# Patient Record
Sex: Female | Born: 1950 | Race: Black or African American | Hispanic: No | Marital: Married | Smoking: Never smoker
Health system: Southern US, Community
[De-identification: ages and names within clinical notes are randomized; demographics above are authoritative.]

## PROBLEM LIST (undated history)

## (undated) DIAGNOSIS — M810 Age-related osteoporosis without current pathological fracture: Secondary | ICD-10-CM

## (undated) DIAGNOSIS — K589 Irritable bowel syndrome without diarrhea: Secondary | ICD-10-CM

## (undated) DIAGNOSIS — R079 Chest pain, unspecified: Secondary | ICD-10-CM

## (undated) DIAGNOSIS — E119 Type 2 diabetes mellitus without complications: Secondary | ICD-10-CM

## (undated) DIAGNOSIS — I5030 Unspecified diastolic (congestive) heart failure: Secondary | ICD-10-CM

## (undated) DIAGNOSIS — I4729 Other ventricular tachycardia: Secondary | ICD-10-CM

## (undated) DIAGNOSIS — I340 Nonrheumatic mitral (valve) insufficiency: Secondary | ICD-10-CM

## (undated) DIAGNOSIS — I10 Essential (primary) hypertension: Secondary | ICD-10-CM

## (undated) DIAGNOSIS — M797 Fibromyalgia: Secondary | ICD-10-CM

## (undated) DIAGNOSIS — K743 Primary biliary cirrhosis: Secondary | ICD-10-CM

## (undated) DIAGNOSIS — F32A Depression, unspecified: Secondary | ICD-10-CM

## (undated) DIAGNOSIS — I493 Ventricular premature depolarization: Secondary | ICD-10-CM

## (undated) DIAGNOSIS — K219 Gastro-esophageal reflux disease without esophagitis: Secondary | ICD-10-CM

## (undated) DIAGNOSIS — M1990 Unspecified osteoarthritis, unspecified site: Secondary | ICD-10-CM

## (undated) DIAGNOSIS — M4800 Spinal stenosis, site unspecified: Secondary | ICD-10-CM

## (undated) DIAGNOSIS — I639 Cerebral infarction, unspecified: Secondary | ICD-10-CM

## (undated) DIAGNOSIS — M069 Rheumatoid arthritis, unspecified: Secondary | ICD-10-CM

## (undated) HISTORY — DX: Other ventricular tachycardia: I47.29

## (undated) HISTORY — DX: Cerebral infarction, unspecified: I63.9

## (undated) HISTORY — DX: Primary biliary cirrhosis: K74.3

## (undated) HISTORY — DX: Unspecified diastolic (congestive) heart failure: I50.30

## (undated) HISTORY — DX: Chest pain, unspecified: R07.9

## (undated) HISTORY — DX: Ventricular premature depolarization: I49.3

## (undated) HISTORY — DX: Nonrheumatic mitral (valve) insufficiency: I34.0

## (undated) HISTORY — DX: Rheumatoid arthritis, unspecified: M06.9

## (undated) HISTORY — PX: OTHER SURGICAL HISTORY: SHX169

## (undated) HISTORY — DX: Spinal stenosis, site unspecified: M48.00

## (undated) HISTORY — DX: Depression, unspecified: F32.A

## (undated) HISTORY — DX: Age-related osteoporosis without current pathological fracture: M81.0

---

## 2004-09-01 ENCOUNTER — Inpatient Hospital Stay: Payer: Self-pay | Admitting: Internal Medicine

## 2004-09-01 ENCOUNTER — Other Ambulatory Visit: Payer: Self-pay

## 2004-09-04 ENCOUNTER — Other Ambulatory Visit: Payer: Self-pay

## 2005-09-13 ENCOUNTER — Other Ambulatory Visit: Payer: Self-pay

## 2005-09-13 ENCOUNTER — Emergency Department: Payer: Self-pay | Admitting: Emergency Medicine

## 2005-09-14 ENCOUNTER — Ambulatory Visit: Payer: Self-pay | Admitting: Emergency Medicine

## 2006-07-10 ENCOUNTER — Other Ambulatory Visit: Payer: Self-pay

## 2006-07-10 ENCOUNTER — Inpatient Hospital Stay: Payer: Self-pay | Admitting: Internal Medicine

## 2007-03-07 ENCOUNTER — Other Ambulatory Visit: Payer: Self-pay

## 2007-03-07 ENCOUNTER — Emergency Department: Payer: Self-pay | Admitting: Emergency Medicine

## 2008-08-07 IMAGING — CR DG CHEST 1V PORT
1 series · 1 of 1 positions shown · non-contrast
Comparison: none

REASON FOR EXAM: Chest pain
COMMENTS:

PROCEDURE:     DXR - DXR PORTABLE CHEST SINGLE VIEW  - March 07, 2007  [DATE]
RESULT:     Comparison is made to a prior study dated 07/10/2006.
The lungs are clear. The cardiac silhouette and visualized bony skeleton are
unremarkable.

[view not recorded]
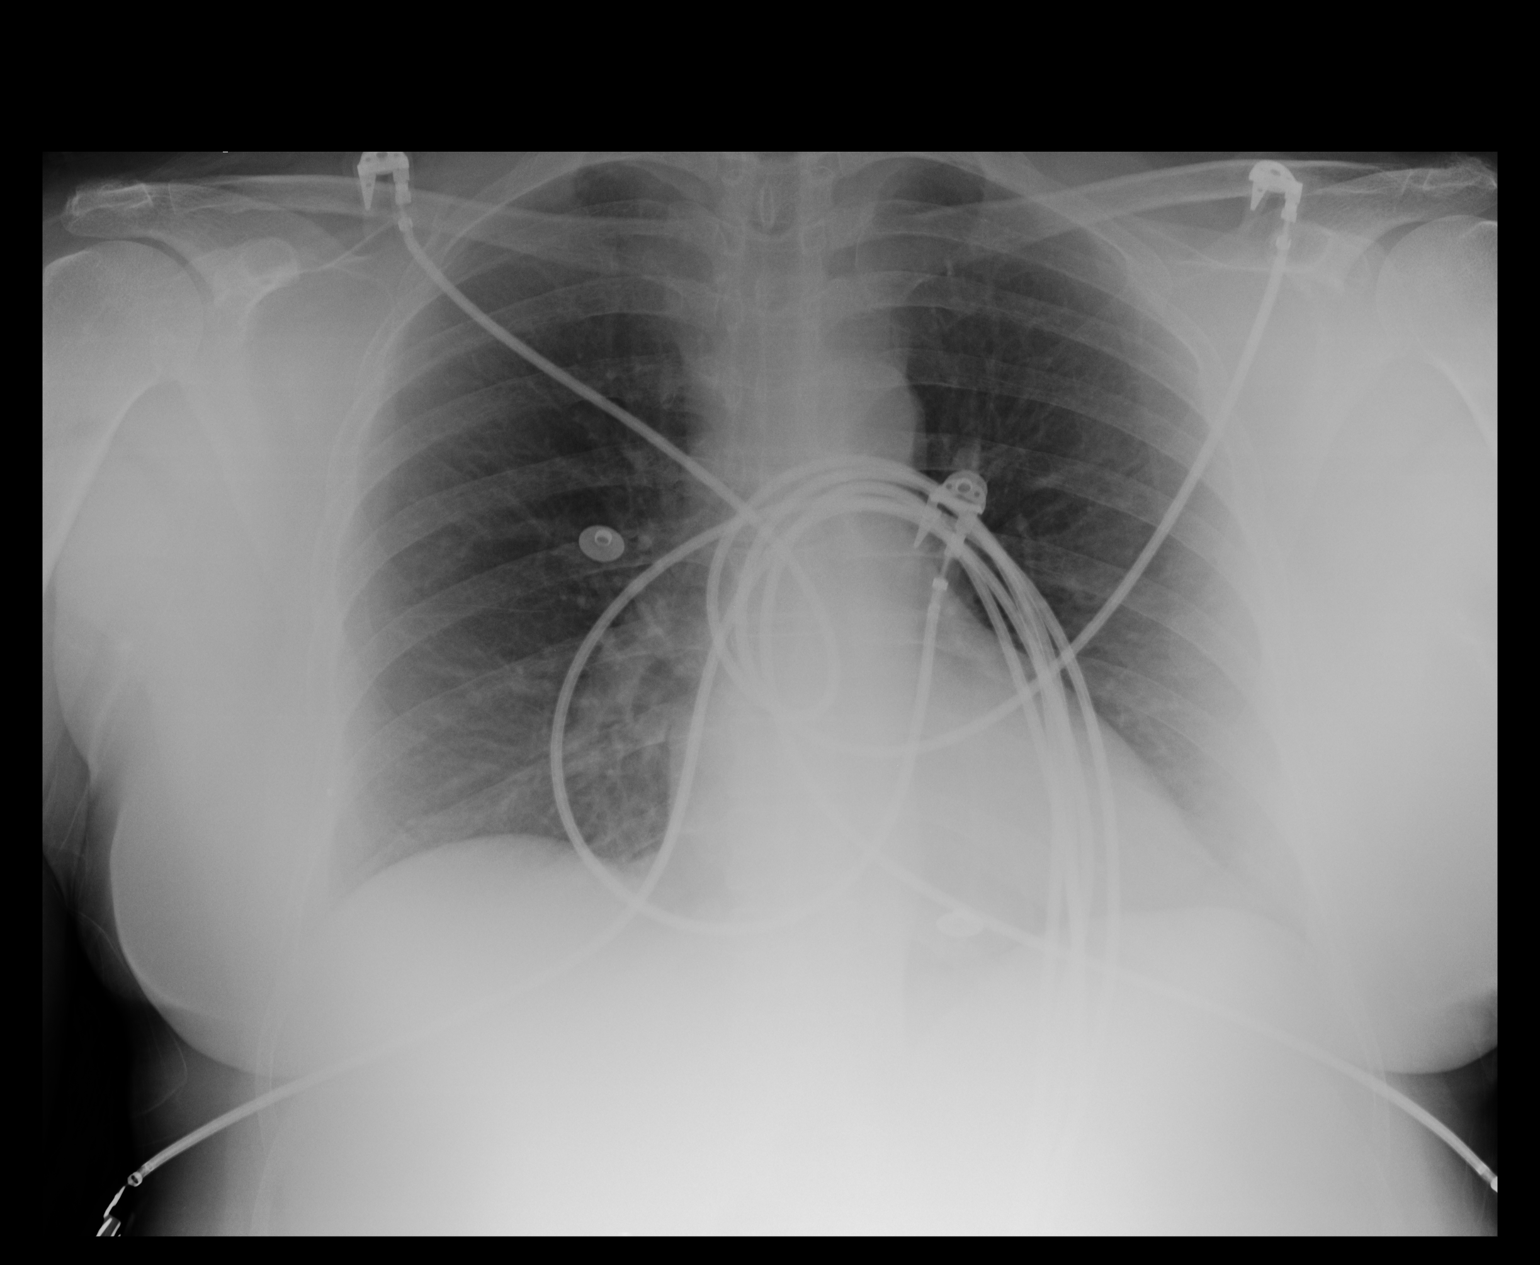

[1 of 1 positions shown; findings below may reference images not displayed]

IMPRESSION: Chest radiograph without evidence of acute cardiopulmonary
disease.

## 2009-10-01 ENCOUNTER — Emergency Department: Payer: Self-pay | Admitting: Emergency Medicine

## 2011-11-12 ENCOUNTER — Emergency Department: Payer: Self-pay | Admitting: Emergency Medicine

## 2013-04-02 DIAGNOSIS — I10 Essential (primary) hypertension: Secondary | ICD-10-CM | POA: Diagnosis present

## 2015-11-04 ENCOUNTER — Emergency Department
Admission: EM | Admit: 2015-11-04 | Discharge: 2015-11-05 | Disposition: A | Discharge status: Home / Self Care | Payer: Medicare Other | Attending: Emergency Medicine | Admitting: Emergency Medicine

## 2015-11-04 ENCOUNTER — Encounter: Payer: Self-pay | Admitting: Emergency Medicine

## 2015-11-04 DIAGNOSIS — E119 Type 2 diabetes mellitus without complications: Secondary | ICD-10-CM | POA: Diagnosis not present

## 2015-11-04 DIAGNOSIS — M1990 Unspecified osteoarthritis, unspecified site: Secondary | ICD-10-CM | POA: Diagnosis not present

## 2015-11-04 DIAGNOSIS — I10 Essential (primary) hypertension: Secondary | ICD-10-CM | POA: Diagnosis not present

## 2015-11-04 DIAGNOSIS — M542 Cervicalgia: Secondary | ICD-10-CM | POA: Diagnosis present

## 2015-11-04 DIAGNOSIS — M436 Torticollis: Secondary | ICD-10-CM | POA: Diagnosis not present

## 2015-11-04 HISTORY — DX: Irritable bowel syndrome, unspecified: K58.9

## 2015-11-04 HISTORY — DX: Unspecified osteoarthritis, unspecified site: M19.90

## 2015-11-04 HISTORY — DX: Type 2 diabetes mellitus without complications: E11.9

## 2015-11-04 HISTORY — DX: Essential (primary) hypertension: I10

## 2015-11-04 HISTORY — DX: Fibromyalgia: M79.7

## 2015-11-04 HISTORY — DX: Gastro-esophageal reflux disease without esophagitis: K21.9

## 2015-11-04 MED ORDER — KETOROLAC TROMETHAMINE 30 MG/ML IJ SOLN
30.0000 mg | Freq: Once | INTRAMUSCULAR | Status: AC
  Administered 2015-11-04: 30 mg via INTRAMUSCULAR
  Filled 2015-11-04: qty 1

## 2015-11-04 MED ORDER — DIAZEPAM 5 MG/ML IJ SOLN
5.0000 mg | Freq: Once | INTRAMUSCULAR | Status: AC
  Administered 2015-11-04: 5 mg via INTRAMUSCULAR
  Filled 2015-11-04: qty 2

## 2015-11-04 MED ORDER — DIAZEPAM 2 MG PO TABS: 2.0000 mg | ORAL_TABLET | Freq: Four times a day (QID) | ORAL | Status: AC | PRN

## 2015-11-04 NOTE — ED Notes (Signed)
Pt arrived to the ED for complaints on neck pain and stiffness secondary to the Pt laying in the ground and positioning her neck in an unusual position. Pt states that she was trying to check a leak in her toilet and when she tried to stand up she noticed that she could not get her neck straight. Pt is AOx4 with mild neck pain.

## 2015-11-04 NOTE — Discharge Instructions (Signed)
Acute Torticollis °Torticollis is a condition in which the muscles of the neck tighten (contract) abnormally, causing the neck to twist and the head to move into an unnatural position. Torticollis that develops suddenly is called acute torticollis. If torticollis becomes chronic and is left untreated, the face and neck can become deformed. °CAUSES °This condition may be caused by: °· Sleeping in an awkward position (common). °· Extending or twisting the neck muscles beyond their normal position. °· Infection. °In some cases, the cause may not be known. °SYMPTOMS °Symptoms of this condition include: °· An unnatural position of the head. °· Neck pain. °· A limited ability to move the neck. °· Twisting of the neck to one side. °DIAGNOSIS °This condition is diagnosed with a physical exam. You may also have imaging tests, such as an X-ray, CT scan, or MRI. °TREATMENT °Treatment for this condition involves trying to relax the neck muscles. It may include: °· Medicines or shots. °· Physical therapy. °· Surgery. This may be done in severe cases. °HOME CARE INSTRUCTIONS °· Take medicines only as directed by your health care provider. °· Do stretching exercises and massage your neck as directed by your health care provider. °· Keep all follow-up visits as directed by your health care provider. This is important. °SEEK MEDICAL CARE IF: °· You develop a fever. °SEEK IMMEDIATE MEDICAL CARE IF: °· You develop difficulty breathing. °· You develop noisy breathing (stridor). °· You start drooling. °· You have trouble swallowing or have pain with swallowing. °· You develop numbness or weakness in your hands or feet. °· You have changes in your speech, understanding, or vision. °· Your pain gets worse. °  °This information is not intended to replace advice given to you by your health care provider. Make sure you discuss any questions you have with your health care provider. °  °Document Released: 04/09/2000 Document Revised:  08/27/2014 Document Reviewed: 04/08/2014 °Elsevier Interactive Patient Education ©2016 [employer] ° °

## 2015-11-04 NOTE — ED Provider Notes (Signed)
[facility] Emergency Department Provider Note ____________________________________________  Time seen: Approximately 11:19 PM  I have reviewed the triage vital signs and the nursing notes.   HISTORY  Chief Complaint Neck Pain and Spasms    HPI Victoria Potter is a 65 y.o. female who presents to the emergency department for evaluation of neck pain. She states that she got down on the floor and reached around the back of the toilet to see if water was coming from around the bottom or under the tank and when she tried to stand back up, she could not turn her head. She denies specific injury. Incident occurred at approximately 7 PM and has not gotten any better over time. She has not taken anything for pain prior to arrival.  Past Medical History  Diagnosis Date  . Hypertension   . Diabetes mellitus without complication (HCC)   . Arthritis   . Fibromyalgia   . GERD (gastroesophageal reflux disease)   . IBS (irritable bowel syndrome)     There are no active problems to display for this patient.   Past Surgical History  Procedure Laterality Date  . Cardiac oblation      Current Outpatient Rx  Name  Route  Sig  Dispense  Refill  . diazepam (VALIUM) 2 MG tablet   Oral   Take 1 tablet (2 mg total) by mouth every 6 (six) hours as needed for muscle spasms.   12 tablet   0     Allergies Erythromycin and Sulfa antibiotics  History reviewed. No pertinent family history.  Social History Social History  Substance Use Topics  . Smoking status: Never Smoker   . Smokeless tobacco: None  . Alcohol Use: No    Review of Systems Constitutional: No recent illness. Cardiovascular: Denies chest pain or palpitations. Respiratory: Denies shortness of breath. Musculoskeletal: Pain in Left neck Skin: Negative for rash, wound, lesion. Neurological: Negative for focal weakness or numbness.  ____________________________________________   PHYSICAL  EXAM:  VITAL SIGNS: ED Triage Vitals  Enc Vitals Group     BP 11/04/15 2233 211/87 mmHg     Pulse Rate 11/04/15 2233 81     Resp 11/04/15 2233 18     Temp 11/04/15 2233 98.4 F (36.9 C)     Temp Source 11/04/15 2233 Oral     SpO2 11/04/15 2233 98 %     Weight 11/04/15 2233 172 lb (78.019 kg)     Height 11/04/15 2233 5\' 3"  (1.6 m)     Head Cir --      Peak Flow --      Pain Score 11/04/15 2238 9     Pain Loc --      Pain Edu? --      Excl. in GC? --     Constitutional: Alert and oriented. Well appearing and in no acute distress. Eyes: Conjunctivae are normal. EOMI. Head: Atraumatic. Neck: No stridor.  Respiratory: Normal respiratory effort.   Musculoskeletal: Tenderness on palpation to the left trapezius area. There is no focal midline tenderness of the cervical or thoracic spine. Neurologic:  Normal speech and language. No gross focal neurologic deficits are appreciated. Speech is normal. No gait instability. Skin:  Skin is warm, dry and intact. Atraumatic. Psychiatric: Mood and affect are normal. Speech and behavior are normal.  ____________________________________________   LABS (all labs ordered are listed, but only abnormal results are displayed)  Labs Reviewed - No data to display ____________________________________________  RADIOLOGY  Not indicated  ____________________________________________   PROCEDURES  Procedure(s) performed: None   ____________________________________________   INITIAL IMPRESSION / ASSESSMENT AND PLAN / ED COURSE  Pertinent labs & imaging results that were available during my care of the patient were reviewed by me and considered in my medical decision making (see chart for details).  Patient was given Valium and Toradol IM in addition to a soft cervical collar while in the ER with moderate relief of pain. She is to receive a prescription for Valium  tablets. She will follow up with orthopedics for symptoms that are not  improving over the next few days. She was advised to return to the ER for symptoms that change or worsen if unable to schedule an appointment. ____________________________________________   FINAL CLINICAL IMPRESSION(S) / ED DIAGNOSES  Final diagnoses:  Torticollis, acute       Chinita Pester, FNP 11/05/15 0007  Minna Antis, MD 11/07/15 [id number]

## 2015-11-04 NOTE — ED Notes (Signed)
Pt in via triage with complaints of left sided neck pain and stiffness since approximately 1900 tonight; pt reports laying on the ground looking under her toilet and when she got up she was unable to turn her head to the right.  Pt A/Ox4, ambulatory to room, in no immediate distress at this time.

## 2023-03-11 DIAGNOSIS — N183 Chronic kidney disease, stage 3 unspecified: Secondary | ICD-10-CM | POA: Insufficient documentation

## 2023-06-17 ENCOUNTER — Emergency Department: Payer: Medicare Other

## 2023-06-17 ENCOUNTER — Other Ambulatory Visit: Payer: Self-pay

## 2023-06-17 ENCOUNTER — Emergency Department
Admission: EM | Admit: 2023-06-17 | Discharge: 2023-06-17 | Disposition: A | Discharge status: Home / Self Care | Payer: Medicare Other | Attending: Emergency Medicine | Admitting: Emergency Medicine

## 2023-06-17 DIAGNOSIS — D72829 Elevated white blood cell count, unspecified: Secondary | ICD-10-CM | POA: Insufficient documentation

## 2023-06-17 DIAGNOSIS — I10 Essential (primary) hypertension: Secondary | ICD-10-CM | POA: Diagnosis not present

## 2023-06-17 DIAGNOSIS — R7989 Other specified abnormal findings of blood chemistry: Secondary | ICD-10-CM | POA: Diagnosis not present

## 2023-06-17 DIAGNOSIS — D649 Anemia, unspecified: Secondary | ICD-10-CM | POA: Insufficient documentation

## 2023-06-17 DIAGNOSIS — R079 Chest pain, unspecified: Secondary | ICD-10-CM | POA: Diagnosis not present

## 2023-06-17 DIAGNOSIS — E1165 Type 2 diabetes mellitus with hyperglycemia: Secondary | ICD-10-CM | POA: Insufficient documentation

## 2023-06-17 DIAGNOSIS — R519 Headache, unspecified: Secondary | ICD-10-CM | POA: Insufficient documentation

## 2023-06-17 LAB — BASIC METABOLIC PANEL
Anion gap: 17 — ABNORMAL HIGH (ref 5–15)
BUN: 19 mg/dL (ref 8–23)
CO2: 16 mmol/L — ABNORMAL LOW (ref 22–32)
Calcium: 9.6 mg/dL (ref 8.9–10.3)
Chloride: 101 mmol/L (ref 98–111)
Creatinine, Ser: 1.22 mg/dL — ABNORMAL HIGH (ref 0.44–1.00)
GFR, Estimated: 48 mL/min — ABNORMAL LOW (ref >60)
Glucose, Bld: 199 mg/dL — ABNORMAL HIGH (ref 70–99)
Potassium: 4.1 mmol/L (ref 3.5–5.1)
Sodium: 134 mmol/L — ABNORMAL LOW (ref 135–145)

## 2023-06-17 LAB — CBC
HCT: 34.8 % — ABNORMAL LOW (ref 36.0–46.0)
Hemoglobin: 11.2 g/dL — ABNORMAL LOW (ref 12.0–15.0)
MCH: 28.8 pg (ref 26.0–34.0)
MCHC: 32.2 g/dL (ref 30.0–36.0)
MCV: 89.5 fL (ref 80.0–100.0)
Platelets: 365 10*3/uL (ref 150–400)
RBC: 3.89 MIL/uL (ref 3.87–5.11)
RDW: 12.6 % (ref 11.5–15.5)
WBC: 15.1 10*3/uL — ABNORMAL HIGH (ref 4.0–10.5)
nRBC: 0 % (ref 0.0–0.2)

## 2023-06-17 LAB — HEPATIC FUNCTION PANEL
ALT: 15 U/L (ref 0–44)
AST: 23 U/L (ref 15–41)
Albumin: 3.7 g/dL (ref 3.5–5.0)
Alkaline Phosphatase: 57 U/L (ref 38–126)
Bilirubin, Direct: <0.1 mg/dL (ref 0.0–0.2)
Total Bilirubin: 0.6 mg/dL (ref 0.0–1.2)
Total Protein: 7.3 g/dL (ref 6.5–8.1)

## 2023-06-17 LAB — LIPASE, BLOOD: Lipase: 34 U/L (ref 11–51)

## 2023-06-17 LAB — TROPONIN I (HIGH SENSITIVITY)
Troponin I (High Sensitivity): 16 ng/L (ref <18)
Troponin I (High Sensitivity): 14 ng/L (ref <18)

## 2023-06-17 MED ORDER — IOHEXOL 350 MG/ML SOLN
75.0000 mL | Freq: Once | INTRAVENOUS | Status: AC | PRN
  Administered 2023-06-17: 75 mL via INTRAVENOUS

## 2023-06-17 MED ORDER — ONDANSETRON HCL 4 MG/2ML IJ SOLN
4.0000 mg | Freq: Once | INTRAMUSCULAR | Status: AC
  Administered 2023-06-17: 4 mg via INTRAVENOUS
  Filled 2023-06-17: qty 2

## 2023-06-17 MED ORDER — SODIUM CHLORIDE 0.9 % IV BOLUS
1000.0000 mL | Freq: Once | INTRAVENOUS | Status: AC
  Administered 2023-06-17: 1000 mL via INTRAVENOUS

## 2023-06-17 MED ORDER — KETOROLAC TROMETHAMINE 15 MG/ML IJ SOLN
15.0000 mg | Freq: Once | INTRAMUSCULAR | Status: AC
  Administered 2023-06-17: 15 mg via INTRAVENOUS
  Filled 2023-06-17: qty 1

## 2023-06-17 MED ORDER — PROCHLORPERAZINE EDISYLATE 10 MG/2ML IJ SOLN: 10.0000 mg | Freq: Once | INTRAMUSCULAR | Status: DC

## 2023-06-17 MED ORDER — DIPHENHYDRAMINE HCL 50 MG/ML IJ SOLN: 25.0000 mg | Freq: Once | INTRAMUSCULAR | Status: DC

## 2023-06-17 NOTE — Discharge Instructions (Signed)
 You were seen in the ER today for evaluation of your headache and chest pain.  Your testing fortunately did not show an emergency cause for this.  I have placed a referral to cardiology to arrange close follow-up for further evaluation of your chest pain.  Please also follow-up with your primary care doctor for further evaluation of your symptoms.  Return to the ER for new or worsening symptoms.

## 2023-06-17 NOTE — ED Triage Notes (Addendum)
 Pt here with CP, SOB and a severe headache for a few days. Pt states she has not been breathing good for a while but worse this morning. Pt states her heart has been pounding, pain is left sided and radiates to her arm. Pt states cp is constant. Pt states she fell out of bed yesterday and hit her head.

## 2023-06-17 NOTE — ED Provider Notes (Signed)
 [facility] Provider Note    Event Date/Time   First MD Initiated Contact with Patient 06/17/23 1529     (approximate)   History   Shortness of Breath and Headache   HPI  Victoria Potter is a 73 year old female with history of HTN, diabetes, primary biliary cholangitis presenting to the emergency department for evaluation of headache and chest pain.  For the past week, patient reports she has had an ongoing generalized headache.  This morning, headache worsened and more so on her left side.  No associated focal numbness, tingling, weakness.  She additionally reports that for the past several days she has had ongoing pain over the left side of her chest with associated shortness of breath.  2 days ago she reports that she passed out, unsure if she had preceding lightheadedness.  Reports memory issues and is unsure if she is experience something similar previously.      Physical Exam   Triage Vital Signs: ED Triage Vitals  Encounter Vitals Group     BP 06/17/23 1429 (!) 182/60     Systolic BP Percentile --      Diastolic BP Percentile --      Pulse Rate 06/17/23 1429 (!) 105     Resp 06/17/23 1429 (!) 21     Temp 06/17/23 1429 98.1 F (36.7 C)     Temp Source 06/17/23 1429 Oral     SpO2 06/17/23 1429 97 %     Weight 06/17/23 1430 171 lb 15.3 oz (78 kg)     Height 06/17/23 1430 5\' 3"  (1.6 m)     Head Circumference --      Peak Flow --      Pain Score 06/17/23 1430 10     Pain Loc --      Pain Education --      Exclude from Growth Chart --     Most recent vital signs: Vitals:   06/17/23 1429 06/17/23 1600  BP: (!) 182/60 (!) 177/61  Pulse: (!) 105 90  Resp: (!) 21 16  Temp: 98.1 F (36.7 C)   SpO2: 97% 98%   General: Awake, interactive  CV:  Regular rate at the time of my evaluation, good peripheral perfusion.  Resp:  Unlabored respirations, lungs clear to auscultation Abd:  Nondistended, soft, nontender to palpation Neuro:  Symmetric  facial movement, mild generalized weakness, moving extremities spontaneously and equally, pupils equal and reactive  ED Results / Procedures / Treatments   Labs (all labs ordered are listed, but only abnormal results are displayed) Labs Reviewed  BASIC METABOLIC PANEL - Abnormal; Notable for the following components:      Result Value   Sodium 134 (*)    CO2 16 (*)    Glucose, Bld 199 (*)    Creatinine, Ser 1.22 (*)    GFR, Estimated 48 (*)    Anion gap 17 (*)    All other components within normal limits  CBC - Abnormal; Notable for the following components:   WBC 15.1 (*)    Hemoglobin 11.2 (*)    HCT 34.8 (*)    All other components within normal limits  HEPATIC FUNCTION PANEL  LIPASE, BLOOD  TROPONIN I (HIGH SENSITIVITY)  TROPONIN I (HIGH SENSITIVITY)     EKG EKG independently reviewed interpreted by myself (ER attending) demonstrates:  EKG demonstrates normal sinus rhythm rate of 98, PR 132, QRS 86, QTc 451, no acute ST changes  RADIOLOGY Imaging independently reviewed  and interpreted by myself demonstrates:  CT head without acute bleed CXR without focal consolidation CTA head and neck without LVO or other acute finding CTA chest without evidence of PE or other acute finding  PROCEDURES:  Critical Care performed: No  Procedures   MEDICATIONS ORDERED IN ED: Medications  ketorolac (TORADOL) 15 MG/ML injection 15 mg (15 mg Intravenous Given 06/17/23 1637)  sodium chloride 0.9 % bolus 1,000 mL (1,000 mLs Intravenous New Bag/Given 06/17/23 1637)  ondansetron (ZOFRAN) injection 4 mg (4 mg Intravenous Given 06/17/23 1637)  iohexol (OMNIPAQUE) 350 MG/ML injection 75 mL (75 mLs Intravenous Contrast Given 06/17/23 1706)     IMPRESSION / MDM / ASSESSMENT AND PLAN / ED COURSE  I reviewed the triage vital signs and the nursing notes.  Differential diagnosis includes, but is not limited to, intracranial bleed, benign headache, ACS, pneumonia, pneumothorax, pulmonary  embolism  Patient's presentation is most consistent with acute presentation with potential threat to life or bodily function.  73 year old female presenting with headache and chest pain.  Mild tachycardia on presentation, improved at the time of my initial evaluation.  CT head and x-Allia Wiltsey ordered from triage overall reassuring.  Labs notable for leukocytosis with WC of 15 without obvious infectious source.  Stable anemia noted.  CMP with mild creatinine elevation at 1.22.  Mild anion gap acidosis noted, but minimally elevated glucose at 199, low suspicion DKA.  Given headache ongoing for several days, will obtain CTA head and neck to further evaluate for acute pathology.  Will also obtain CT of the chest.  CT imaging overall reassuring.  Troponin x 2 negative.  Patient reevaluated, does report improvement in her symptoms. Patient is comfortable with discharge home.  Will place referral for cardiology.  Strict return precautions provided.  Patient discharged in stable condition.     FINAL CLINICAL IMPRESSION(S) / ED DIAGNOSES   Final diagnoses:  Acute nonintractable headache, unspecified headache type  Nonspecific chest pain     Rx / DC Orders   ED Discharge Orders          Ordered    Ambulatory referral to Cardiology       Comments: If you have not heard from the Cardiology office within the next 72 hours please call [phone].   06/17/23 1837             Note:  This document was prepared using Dragon voice recognition software and may include unintentional dictation errors.   [name], MD 06/17/23 [phone]

## 2023-06-23 ENCOUNTER — Encounter: Payer: Self-pay | Admitting: Nurse Practitioner

## 2023-06-23 ENCOUNTER — Ambulatory Visit: Payer: Medicare Other | Attending: Nurse Practitioner | Admitting: Nurse Practitioner

## 2023-06-23 NOTE — Progress Notes (Deleted)
 Cardiology Clinic Note   Patient Name: Victoria Potter Date of Encounter: 06/23/2023  Primary Care Provider:  Lance Morin, MD Primary Cardiologist:  None  Patient Profile    73 y.o. female with a history of chest pain, HFpEF, PVCs, nonsustained VT, hypertension, hyperlipidemia, diabetes, fibromyalgia rheumatoid arthritis on chronic steroids, primary biliary cholangitis, osteoporosis, depression, GERD, glaucoma, osteoarthritis, osteoporosis, spinal stenosis, stroke, who presents for follow-up secondary to chest pain.  Past Medical History    Past Medical History:  Diagnosis Date   (HFpEF) heart failure with preserved ejection fraction (HCC)    a. 03/2021 Echo: EF 55-60%, GrII DD, mild MR.   Arthritis    Chest pain    Depression    Diabetes mellitus without complication (HCC)    Fibromyalgia    GERD (gastroesophageal reflux disease)    Hypertension    IBS (irritable bowel syndrome)    Mitral regurgitation    a. 03/2021 Echo: Mild MR.   NSVT (nonsustained ventricular tachycardia) (HCC)    01/2011 cMRI: EF 62%, nl RV fxn, no scar/infiltration, no ARVD.   Osteoporosis    Primary biliary cholangitis (HCC)    PVC's (premature ventricular contractions)    Rheumatoid arthritis (HCC)    Spinal stenosis    Stroke [facility])    Past Surgical History:  Procedure Laterality Date   cardiac oblation      Allergies  Allergies  Allergen Reactions   Erythromycin Nausea And Vomiting   Sulfa Antibiotics Nausea And Vomiting    History of Present Illness      73 y.o. y/o female with a history of chest pain, HFpEF, PVCs, nonsustained VT, hypertension, hyperlipidemia, diabetes, fibromyalgia rheumatoid arthritis on chronic steroids, primary biliary cholangitis, osteoporosis, depression, GERD, glaucoma, osteoarthritis, osteoporosis, spinal stenosis, and stroke.  In the setting of PVCs nonsustained VT, she previous underwent cardiac MRI at [facility] in 2012, which showed normal biventricular  function without evidence of scar, infiltration, or ARVD.***More recently, she underwent echocardiogram in December 2022 showing an EF of 55 to 60% with grade 2 diastolic dysfunction, and mild MR.  Victoria Potter recently presented to the [facility] emergency department on February 21 with complaints of headache but also reported a several day history of chest pain and dyspnea as well as a syncopal episode approximately 71 days prior to presentation.  In the emergency department, she was hypertensive at 182/60 as well as tachycardic at 105.  ECG showed sinus rhythm at 98 with nonspecific ST and T changes in the setting of baseline artifact.  Lab work was notable for sodium 134, CO2 16, glucose of 199, creatinine 1.22 (was 1.08 in November 2024), normal troponins, H&H of 11.2/34.8, and WBC 15.1.  CT of the chest was negative for PE or acute intrathoracic process.  CT of the chest was negative for PE or acute intrathoracic process.  Aortic atherosclerosis was noted.  CTA of the head and neck was negative for large vessel occlusion.  There was moderate diffuse atherosclerotic disease without significant stenosis in the carotids.  She was discharged home and referred to cardiology for further evaluation.     Objective  Home Medications    No current outpatient medications on file.   No current facility-administered medications for this visit.     Family History    Family History  Problem Relation Age of Onset   Colon cancer Mother        Died at 32   Hypertension Mother    Osteoporosis Mother  Cancer Father    COPD Sister    Heart disease Sister    Heart disease Brother    She indicated that her mother is deceased. She indicated that her father is deceased. She indicated that the status of her sister is unknown. She indicated that the status of her brother is unknown.   Social History    Social History   Socioeconomic History   Marital status: Married    Spouse name: Not on file   Number of  children: Not on file   Years of education: Not on file   Highest education level: Not on file  Occupational History   Not on file  Tobacco Use   Smoking status: Former    Current packs/day: 0.00    Average packs/day: 0.5 packs/day for 25.0 years (12.5 ttl pk-yrs)    Types: Cigarettes    Start date: 04/27/1979    Quit date: 04/26/2004    Years since quitting: 19.1   Smokeless tobacco: Not on file  Substance and Sexual Activity   Alcohol use: No   Drug use: No   Sexual activity: Never  Other Topics Concern   Not on file  Social History Narrative   Not on file   Social Drivers of Health   Financial Resource Strain: Low Risk  (06/15/2022)   Received from [facility]   Overall Financial Resource Strain (CARDIA)    Difficulty of Paying Living Expenses: Not hard at all  Food Insecurity: No Food Insecurity (06/15/2022)   Received from [facility]   Hunger Vital Sign    Worried About Running Out of Food in the Last Year: Never true    Ran Out of Food in the Last Year: Never true  Transportation Needs: No Transportation Needs (06/15/2022)   Received from [facility]   PRAPARE - Transportation    Lack of Transportation (Medical): No    Lack of Transportation (Non-Medical): No  Physical Activity: Insufficiently Active (06/15/2022)   Received from [facility]   Exercise Vital Sign    Days of Exercise per Week: 7 days    Minutes of Exercise per Session: 10 min  Stress: No Stress Concern Present (06/15/2022)   Received from [facility] - Occupational Stress Questionnaire    Feeling of Stress : Not at all  Social Connections: Moderately Isolated (06/15/2022)   Received from [facility]   Social Connection and Isolation Panel [NHANES]    Frequency of Communication with Friends and Family: More than three times a week    Frequency of Social Gatherings with Friends and Family: More than three times a week    Attends Religious  Services: Never    Database administrator or Organizations: No    Attends Banker Meetings: Never    Marital Status: Married  Catering manager Violence: Not At Risk (06/15/2022)   Received from [facility]   Humiliation, Afraid, Rape, and Kick questionnaire    Fear of Current or Ex-Partner: No    Emotionally Abused: No    Physically Abused: No    Sexually Abused: No     Review of Systems    General:  No chills, fever, night sweats or weight changes.  Cardiovascular:  No chest pain, dyspnea on exertion, edema, orthopnea, palpitations, paroxysmal nocturnal dyspnea. Dermatological: No rash, lesions/masses Respiratory: No cough, dyspnea Urologic: No hematuria, dysuria Abdominal:   No nausea, vomiting, diarrhea, bright red  blood per rectum, melena, or hematemesis Neurologic:  No visual changes, wkns, changes in mental status. All other systems reviewed and are otherwise negative except as noted above.     Physical Exam    VS:  There were no vitals taken for this visit. , BMI There is no height or weight on file to calculate BMI.     GEN: Well nourished, well developed, in no acute distress. HEENT: normal. Neck: Supple, no JVD, carotid bruits, or masses. Cardiac: RRR, no murmurs, rubs, or gallops. No clubbing, cyanosis, edema.  Radials 2+/PT 2+ and equal bilaterally.  Respiratory:  Respirations regular and unlabored, clear to auscultation bilaterally. GI: Soft, nontender, nondistended, BS + x 4. MS: no deformity or atrophy. Skin: warm and dry, no rash. Neuro:  Strength and sensation are intact. Psych: Normal affect.  Accessory Clinical Findings    ECG personally reviewed by me today-    *** - No acute changes  Lab Results  Component Value Date   WBC 15.1 (H) 06/17/2023   HGB 11.2 (L) 06/17/2023   HCT 34.8 (L) 06/17/2023   MCV 89.5 06/17/2023   PLT 365 06/17/2023   Lab Results  Component Value Date   CREATININE 1.22 (H) 06/17/2023   BUN 19 06/17/2023    NA 134 (L) 06/17/2023   K 4.1 06/17/2023   CL 101 06/17/2023   CO2 16 (L) 06/17/2023   Lab Results  Component Value Date   ALT 15 06/17/2023   AST 23 06/17/2023   ALKPHOS 57 06/17/2023   BILITOT 0.6 06/17/2023   No results found for: "CHOL", "HDL", "LDLCALC", "LDLDIRECT", "TRIG", "CHOLHDL"  No results found for: "HGBA1C"    Assessment & Plan   1.  ***  Nicolasa Ducking, NP 06/23/2023, 1:20 PM

## 2023-11-23 ENCOUNTER — Emergency Department

## 2023-11-23 ENCOUNTER — Inpatient Hospital Stay
Admission: EM | Admit: 2023-11-23 | Discharge: 2023-11-25 | DRG: 689 | Disposition: A | Discharge status: Home / Self Care | Attending: Internal Medicine | Admitting: Internal Medicine

## 2023-11-23 ENCOUNTER — Other Ambulatory Visit: Payer: Self-pay

## 2023-11-23 DIAGNOSIS — M069 Rheumatoid arthritis, unspecified: Secondary | ICD-10-CM | POA: Diagnosis present

## 2023-11-23 DIAGNOSIS — Z800 Family history of malignant neoplasm of digestive organs: Secondary | ICD-10-CM

## 2023-11-23 DIAGNOSIS — Z825 Family history of asthma and other chronic lower respiratory diseases: Secondary | ICD-10-CM

## 2023-11-23 DIAGNOSIS — M810 Age-related osteoporosis without current pathological fracture: Secondary | ICD-10-CM | POA: Diagnosis present

## 2023-11-23 DIAGNOSIS — N390 Urinary tract infection, site not specified: Secondary | ICD-10-CM | POA: Diagnosis present

## 2023-11-23 DIAGNOSIS — I959 Hypotension, unspecified: Secondary | ICD-10-CM | POA: Diagnosis present

## 2023-11-23 DIAGNOSIS — N179 Acute kidney failure, unspecified: Secondary | ICD-10-CM | POA: Diagnosis present

## 2023-11-23 DIAGNOSIS — G9341 Metabolic encephalopathy: Principal | ICD-10-CM | POA: Diagnosis present

## 2023-11-23 DIAGNOSIS — R338 Other retention of urine: Secondary | ICD-10-CM

## 2023-11-23 DIAGNOSIS — Z87891 Personal history of nicotine dependence: Secondary | ICD-10-CM | POA: Diagnosis not present

## 2023-11-23 DIAGNOSIS — E860 Dehydration: Secondary | ICD-10-CM | POA: Diagnosis present

## 2023-11-23 DIAGNOSIS — Z881 Allergy status to other antibiotic agents status: Secondary | ICD-10-CM | POA: Diagnosis not present

## 2023-11-23 DIAGNOSIS — E119 Type 2 diabetes mellitus without complications: Secondary | ICD-10-CM

## 2023-11-23 DIAGNOSIS — I10 Essential (primary) hypertension: Secondary | ICD-10-CM | POA: Diagnosis not present

## 2023-11-23 DIAGNOSIS — E1122 Type 2 diabetes mellitus with diabetic chronic kidney disease: Secondary | ICD-10-CM | POA: Diagnosis present

## 2023-11-23 DIAGNOSIS — E876 Hypokalemia: Secondary | ICD-10-CM | POA: Diagnosis present

## 2023-11-23 DIAGNOSIS — M797 Fibromyalgia: Secondary | ICD-10-CM | POA: Diagnosis present

## 2023-11-23 DIAGNOSIS — R9431 Abnormal electrocardiogram [ECG] [EKG]: Secondary | ICD-10-CM | POA: Diagnosis present

## 2023-11-23 DIAGNOSIS — I5032 Chronic diastolic (congestive) heart failure: Secondary | ICD-10-CM | POA: Diagnosis present

## 2023-11-23 DIAGNOSIS — Z7952 Long term (current) use of systemic steroids: Secondary | ICD-10-CM

## 2023-11-23 DIAGNOSIS — N201 Calculus of ureter: Secondary | ICD-10-CM | POA: Diagnosis present

## 2023-11-23 DIAGNOSIS — I130 Hypertensive heart and chronic kidney disease with heart failure and stage 1 through stage 4 chronic kidney disease, or unspecified chronic kidney disease: Secondary | ICD-10-CM | POA: Diagnosis present

## 2023-11-23 DIAGNOSIS — Z794 Long term (current) use of insulin: Secondary | ICD-10-CM | POA: Diagnosis not present

## 2023-11-23 DIAGNOSIS — Z8262 Family history of osteoporosis: Secondary | ICD-10-CM

## 2023-11-23 DIAGNOSIS — R7401 Elevation of levels of liver transaminase levels: Secondary | ICD-10-CM

## 2023-11-23 DIAGNOSIS — N1831 Chronic kidney disease, stage 3a: Secondary | ICD-10-CM | POA: Diagnosis present

## 2023-11-23 DIAGNOSIS — Z882 Allergy status to sulfonamides status: Secondary | ICD-10-CM | POA: Diagnosis not present

## 2023-11-23 DIAGNOSIS — Z8249 Family history of ischemic heart disease and other diseases of the circulatory system: Secondary | ICD-10-CM

## 2023-11-23 DIAGNOSIS — Z8673 Personal history of transient ischemic attack (TIA), and cerebral infarction without residual deficits: Secondary | ICD-10-CM

## 2023-11-23 LAB — URINALYSIS, ROUTINE W REFLEX MICROSCOPIC
Bilirubin Urine: NEGATIVE
Glucose, UA: 50 mg/dL — AB
Ketones, ur: NEGATIVE mg/dL
Nitrite: NEGATIVE
Protein, ur: >=300 mg/dL — AB
Specific Gravity, Urine: 1.022 (ref 1.005–1.030)
WBC, UA: >50 WBC/hpf (ref 0–5)
pH: 5 (ref 5.0–8.0)

## 2023-11-23 LAB — CBC WITH DIFFERENTIAL/PLATELET
Abs Immature Granulocytes: 0.03 K/uL (ref 0.00–0.07)
Basophils Absolute: 0.1 K/uL (ref 0.0–0.1)
Basophils Relative: 1 %
Eosinophils Absolute: 0 K/uL (ref 0.0–0.5)
Eosinophils Relative: 0 %
HCT: 38.3 % (ref 36.0–46.0)
Hemoglobin: 12.3 g/dL (ref 12.0–15.0)
Immature Granulocytes: 0 %
Lymphocytes Relative: 10 %
Lymphs Abs: 1.1 K/uL (ref 0.7–4.0)
MCH: 27.6 pg (ref 26.0–34.0)
MCHC: 32.1 g/dL (ref 30.0–36.0)
MCV: 86.1 fL (ref 80.0–100.0)
Monocytes Absolute: 0.6 K/uL (ref 0.1–1.0)
Monocytes Relative: 6 %
Neutro Abs: 8.5 K/uL — ABNORMAL HIGH (ref 1.7–7.7)
Neutrophils Relative %: 83 %
Platelets: 376 K/uL (ref 150–400)
RBC: 4.45 MIL/uL (ref 3.87–5.11)
RDW: 13 % (ref 11.5–15.5)
WBC: 10.3 K/uL (ref 4.0–10.5)
nRBC: 0 % (ref 0.0–0.2)

## 2023-11-23 LAB — COMPREHENSIVE METABOLIC PANEL WITH GFR
ALT: 73 U/L — ABNORMAL HIGH (ref 0–44)
AST: 65 U/L — ABNORMAL HIGH (ref 15–41)
Albumin: 3.2 g/dL — ABNORMAL LOW (ref 3.5–5.0)
Alkaline Phosphatase: 94 U/L (ref 38–126)
Anion gap: 12 (ref 5–15)
BUN: 60 mg/dL — ABNORMAL HIGH (ref 8–23)
CO2: 25 mmol/L (ref 22–32)
Calcium: 9.3 mg/dL (ref 8.9–10.3)
Chloride: 104 mmol/L (ref 98–111)
Creatinine, Ser: 2.47 mg/dL — ABNORMAL HIGH (ref 0.44–1.00)
GFR, Estimated: 20 mL/min — ABNORMAL LOW (ref >60)
Glucose, Bld: 214 mg/dL — ABNORMAL HIGH (ref 70–99)
Potassium: 3.3 mmol/L — ABNORMAL LOW (ref 3.5–5.1)
Sodium: 141 mmol/L (ref 135–145)
Total Bilirubin: 3.2 mg/dL — ABNORMAL HIGH (ref 0.0–1.2)
Total Protein: 6.5 g/dL (ref 6.5–8.1)

## 2023-11-23 LAB — LACTIC ACID, PLASMA: Lactic Acid, Venous: 1.5 mmol/L (ref 0.5–1.9)

## 2023-11-23 LAB — C-REACTIVE PROTEIN: CRP: 1 mg/dL — ABNORMAL HIGH (ref <1.0)

## 2023-11-23 LAB — SEDIMENTATION RATE: Sed Rate: 32 mm/h — ABNORMAL HIGH (ref 0–22)

## 2023-11-23 LAB — MAGNESIUM: Magnesium: 2.4 mg/dL (ref 1.7–2.4)

## 2023-11-23 LAB — PROTIME-INR
INR: 1.2 (ref 0.8–1.2)
Prothrombin Time: 15.7 s — ABNORMAL HIGH (ref 11.4–15.2)

## 2023-11-23 LAB — GLUCOSE, CAPILLARY: Glucose-Capillary: 220 mg/dL — ABNORMAL HIGH (ref 70–99)

## 2023-11-23 MED ORDER — INSULIN ASPART 100 UNIT/ML IJ SOLN
0.0000 [IU] | Freq: Every day | INTRAMUSCULAR | Status: DC
  Administered 2023-11-23: 2 [IU] via SUBCUTANEOUS
  Filled 2023-11-23: qty 1

## 2023-11-23 MED ORDER — ACETAMINOPHEN 650 MG RE SUPP
650.0000 mg | Freq: Four times a day (QID) | RECTAL | Status: DC | PRN
Start: 2023-11-23 — End: 2023-11-25

## 2023-11-23 MED ORDER — INSULIN ASPART 100 UNIT/ML IJ SOLN
0.0000 [IU] | Freq: Three times a day (TID) | INTRAMUSCULAR | Status: DC
  Administered 2023-11-24 – 2023-11-25 (×2): 3 [IU] via SUBCUTANEOUS
  Filled 2023-11-23 (×2): qty 1

## 2023-11-23 MED ORDER — LACTATED RINGERS IV BOLUS
1000.0000 mL | Freq: Once | INTRAVENOUS | Status: AC
  Administered 2023-11-23: 1000 mL via INTRAVENOUS

## 2023-11-23 MED ORDER — SODIUM CHLORIDE 0.9 % IV SOLN
1.0000 g | Freq: Once | INTRAVENOUS | Status: AC
  Administered 2023-11-23: 1 g via INTRAVENOUS
  Filled 2023-11-23: qty 10

## 2023-11-23 MED ORDER — POTASSIUM CHLORIDE 20 MEQ PO PACK
60.0000 meq | PACK | Freq: Once | ORAL | Status: AC
  Administered 2023-11-23: 60 meq via ORAL
  Filled 2023-11-23: qty 3

## 2023-11-23 MED ORDER — LACTATED RINGERS IV SOLN: INTRAVENOUS | Status: DC

## 2023-11-23 MED ORDER — ACETAMINOPHEN 325 MG PO TABS
650.0000 mg | ORAL_TABLET | Freq: Four times a day (QID) | ORAL | Status: DC | PRN
  Administered 2023-11-24 – 2023-11-25 (×2): 650 mg via ORAL
  Filled 2023-11-23 (×2): qty 2

## 2023-11-23 MED ORDER — TAMSULOSIN HCL 0.4 MG PO CAPS
0.4000 mg | ORAL_CAPSULE | Freq: Every day | ORAL | Status: DC
  Administered 2023-11-24 – 2023-11-25 (×2): 0.4 mg via ORAL
  Filled 2023-11-23 (×2): qty 1

## 2023-11-23 MED ORDER — SODIUM CHLORIDE 0.9 % IV SOLN
1.0000 g | INTRAVENOUS | Status: DC
  Administered 2023-11-24 – 2023-11-25 (×2): 1 g via INTRAVENOUS
  Filled 2023-11-23 (×2): qty 10

## 2023-11-23 MED ORDER — HEPARIN SODIUM (PORCINE) 5000 UNIT/ML IJ SOLN
5000.0000 [IU] | Freq: Three times a day (TID) | INTRAMUSCULAR | Status: DC
  Administered 2023-11-23 – 2023-11-25 (×6): 5000 [IU] via SUBCUTANEOUS
  Filled 2023-11-23 (×6): qty 1

## 2023-11-23 MED ORDER — INSULIN GLARGINE-YFGN 100 UNIT/ML ~~LOC~~ SOLN
20.0000 [IU] | Freq: Every day | SUBCUTANEOUS | Status: DC
  Administered 2023-11-23 – 2023-11-24 (×2): 20 [IU] via SUBCUTANEOUS
  Filled 2023-11-23 (×4): qty 0.2

## 2023-11-23 MED ORDER — TAMSULOSIN HCL 0.4 MG PO CAPS
0.4000 mg | ORAL_CAPSULE | Freq: Once | ORAL | Status: AC
  Administered 2023-11-23: 0.4 mg via ORAL
  Filled 2023-11-23: qty 1

## 2023-11-23 NOTE — ED Notes (Signed)
 Pt transported to CT ?

## 2023-11-23 NOTE — ED Provider Notes (Addendum)
 [facility] Provider Note    Event Date/Time   First MD Initiated Contact with Patient 11/23/23 1505     (approximate)   History   Hypotension   HPI  Victoria Potter is a 73 y.o. female who presents to the ED for evaluation of Hypotension   Review of PCP visit from November.  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  She reports feeling soreness in her lower abdomen but has no other complaints.  She is disoriented and history is limited from her  Majority of history is provided by her husband and daughter who arrive shortly after patient does.  They report that she has hardly left her room for the past 4 to 5 days.  Refusing to eat.  No falls or syncopal episodes, no acute events.  No known emesis, stool or urinary changes.   Physical Exam   Triage Vital Signs: ED Triage Vitals  Encounter Vitals Group     BP      Girls Systolic BP Percentile      Girls Diastolic BP Percentile      Boys Systolic BP Percentile      Boys Diastolic BP Percentile      Pulse      Resp      Temp      Temp src      SpO2      Weight      Height      Head Circumference      Peak Flow      Pain Score      Pain Loc      Pain Education      Exclude from Growth Chart     Most recent vital signs: Vitals:   11/23/23 1600 11/23/23 1630  BP: 124/60 (!) 127/59  Pulse: 90 88  Resp: 18 13  Temp:    SpO2: 99% 99%    General: Awake, no distress.  Pleasantly disoriented, follows commands CV:  Good peripheral perfusion.  Resp:  Normal effort.  Abd:  No distention.  Suprapubic tenderness but otherwise benign abdomen.  No peritoneal features MSK:  No deformity noted.  Neuro:  No focal deficits appreciated. Other:     ED Results / Procedures / Treatments   Labs (all labs ordered are listed, but only abnormal results are  displayed) Labs Reviewed  COMPREHENSIVE METABOLIC PANEL WITH GFR - Abnormal; Notable for the following components:      Result Value   Potassium 3.3 (*)    Glucose, Bld 214 (*)    BUN 60 (*)    Creatinine, Ser 2.47 (*)    Albumin 3.2 (*)    AST 65 (*)    ALT 73 (*)    Total Bilirubin 3.2 (*)    GFR, Estimated 20 (*)    All other components within normal limits  URINALYSIS, ROUTINE W REFLEX MICROSCOPIC - Abnormal; Notable for the following components:   Color, Urine AMBER (*)    APPearance HAZY (*)    Glucose, UA 50 (*)    Hgb urine dipstick MODERATE (*)    Protein, ur >=300 (*)    Leukocytes,Ua MODERATE (*)    Bacteria, UA RARE (*)    All other components within normal limits  PROTIME-INR - Abnormal; Notable for the following components:   Prothrombin Time 15.7 (*)  All other components within normal limits  CBC WITH DIFFERENTIAL/PLATELET - Abnormal; Notable for the following components:   Neutro Abs 8.5 (*)    All other components within normal limits  CULTURE, BLOOD (ROUTINE X 2)  CULTURE, BLOOD (ROUTINE X 2)  URINE CULTURE  LACTIC ACID, PLASMA  MAGNESIUM  LACTIC ACID, PLASMA    EKG Sinus rhythm with a rate of 92 bpm.  Prolonged QTc at 532.  No high-grade block.  No acute ischemic features  RADIOLOGY CXR interpreted by me without evidence of acute cardiopulmonary pathology. CT head interpreted by me without evidence of acute intracranial pathology CT abdomen/pelvis interpreted by me with a small proximal left ureteral stone without associated hydronephrosis.  Official radiology report(s): CT HEAD WO CONTRAST ( ) Result Date: 11/23/2023 CLINICAL DATA:  nonfocal encephalopathy EXAM: CT HEAD WITHOUT CONTRAST TECHNIQUE: Contiguous axial images were obtained from the base of the skull through the vertex without intravenous contrast. RADIATION DOSE REDUCTION: This exam was performed according to the departmental dose-optimization program which includes automated  exposure control, adjustment of the mA and/or kV according to patient size and/or use of iterative reconstruction technique. COMPARISON:  June 17, 2023 FINDINGS: Brain: Proportional prominence of the ventricles and sulci, consistent with diffuse cerebral parenchymal volume loss. The ventricles otherwise maintained midline position without midline shift. Gray-white differentiation is preserved.Scattered periventricular white matter hypoattenuation, most consistent with changes of mild chronic ischemic microvascular disease.No evidence of acute territorial infarction, extra-axial fluid collection, hemorrhage, or mass lesion. The basilar cisterns are patent without downward herniation. The cerebellar hemispheres and vermis are well formed without mass lesion or focal attenuation abnormality. Vascular: No hyperdense vessel. Calcified atherosclerotic plaque within the cavernous/supraclinoid ICA and intradural vertebral arteries. Skull: Normal. Negative for fracture or focal lesion. Sinuses/Orbits: Small air-fluid level within the left sphenoid sinus. The remaining paranasal sinuses and mastoids are clear.The globes appear intact. No retrobulbar hematoma. Other: None. IMPRESSION: 1. No acute intracranial abnormality, specifically, no acute hemorrhage, territorial infarction, or intracranial mass. 2. Small air-fluid level within the left sphenoid sinus, which can be seen in acute sinusitis. Clinical correlation requested. Electronically Signed   By: Rogelia Myers M.D.   On: 11/23/2023 16:43   CT ABDOMEN PELVIS WO CONTRAST Result Date: 11/23/2023 CLINICAL DATA:  Transaminitis.  Elevated bilirubin. EXAM: CT ABDOMEN AND PELVIS WITHOUT CONTRAST TECHNIQUE: Multidetector CT imaging of the abdomen and pelvis was performed following the standard protocol without IV contrast. RADIATION DOSE REDUCTION: This exam was performed according to the departmental dose-optimization program which includes automated exposure control,  adjustment of the mA and/or kV according to patient size and/or use of iterative reconstruction technique. FINDINGS: Lower chest: No acute abnormality. Hepatobiliary: No focal liver abnormality is seen. No gallstones, gallbladder wall thickening, or biliary dilatation. Pancreas: Unremarkable. No pancreatic ductal dilatation or surrounding inflammatory changes. Spleen: Normal in size without focal abnormality. Adrenals/Urinary Tract: There is a 3 mm calculus in the proximal left ureter. There is no significant hydronephrosis. There are additional punctate left renal calculi. The right kidney, adrenal glands, and bladder are within normal limits. Stomach/Bowel: Stomach is within normal limits. Appendix is not seen. No evidence of bowel wall thickening, distention, or inflammatory changes. There is diffuse colonic diverticulosis. Vascular/Lymphatic: Aortic atherosclerosis. No enlarged abdominal or pelvic lymph nodes. Reproductive: Status post hysterectomy. No adnexal masses. Other: No abdominal wall hernia or abnormality. No abdominopelvic ascites. Musculoskeletal: No acute or significant osseous findings. IMPRESSION: 1. 3 mm calculus in the proximal left ureter without significant hydronephrosis. 2. Additional  punctate left renal calculi. 3. Colonic diverticulosis without evidence for diverticulitis. Aortic Atherosclerosis (ICD10-I70.0). Electronically Signed   By: Greig Pique M.D.   On: 11/23/2023 16:33   DG Chest Port 1 View Result Date: 11/23/2023 CLINICAL DATA:  Question sepsis. EXAM: PORTABLE CHEST 1 VIEW COMPARISON:  Chest radiograph dated 06/17/2023. FINDINGS: Mild cardiomegaly with mild central vascular congestion. No focal consolidation, pleural effusion, pneumothorax. Atherosclerotic calcification of the aorta. No acute osseous pathology. IMPRESSION: Mild cardiomegaly with mild central vascular congestion. No focal consolidation. Electronically Signed   By: Vanetta Chou M.D.   On: 11/23/2023 15:43     PROCEDURES and INTERVENTIONS:  .1-3 Lead EKG Interpretation  Performed by: Claudene Rover, MD Authorized by: Claudene Rover, MD     Interpretation: normal     ECG rate:  90   ECG rate assessment: normal     Rhythm: sinus rhythm     Ectopy: none     Conduction: normal   .Critical Care  Performed by: Claudene Rover, MD Authorized by: Claudene Rover, MD   Critical care provider statement:    Critical care time (minutes):  30   Critical care time was exclusive of:  Separately billable procedures and treating other patients   Critical care was necessary to treat or prevent imminent or life-threatening deterioration of the following conditions:  Sepsis   Critical care was time spent personally by me on the following activities:  Development of treatment plan with patient or surrogate, discussions with consultants, evaluation of patient's response to treatment, examination of patient, ordering and review of laboratory studies, ordering and review of radiographic studies, ordering and performing treatments and interventions, pulse oximetry, re-evaluation of patient's condition and review of old charts   Medications  cefTRIAXone  (ROCEPHIN ) 1 g in sodium chloride  0.9 % 100 mL IVPB (1 g Intravenous New Bag/Given 11/23/23 1641)  tamsulosin  (FLOMAX ) capsule 0.4 mg (has no administration in time range)  lactated ringers  bolus 1,000 mL (0 mLs Intravenous Stopped 11/23/23 1633)     IMPRESSION / MDM / ASSESSMENT AND PLAN / ED COURSE  I reviewed the triage vital signs and the nursing notes.  Differential diagnosis includes, but is not limited to, metabolic cephalopathy, stroke, dehydration, AKI, sepsis, UTI  {Patient presents with symptoms of an acute illness or injury that is potentially life-threatening.  73 year old woman presents from home with a few days of rapid decline with generalized weakness.  No focal weakness on my exam, mild suprapubic tenderness.  AKI, normal CBC.  Transaminitis and  hyperbilirubinemia is noted with a normal alkaline phosphatase.  Awaiting UA to assess for cystitis and metabolic encephalopathy.  Awaiting CT head due to her encephalopathy, we will also perform a noncontrast abdomen/pelvis exam considering her serum derangements.  Anticipate admission.  As below, CT head is reassuring and abdomen/pelvis shows a small ureteral stone without associated hydronephrosis.  Will keep the patient here and manage medically.  Consult with medicine for admission.  If she has significant decline overnight, may require transfer to [facility] for ureteral stenting.  Will add Flomax   Clinical Course as of 11/23/23 1834  Wed Nov 23, 2023  1536 Reassessed, husband and daughter at the bedside provides a supplemental history. [DS]  1646 CT noted with a proximal ureteral stone, UTI and AKI.  [facility] urology [DS]  [phone] I consult with Urology , Dr. Renda. He's covering from [place].  Due to current stability and his different location he agrees would be reasonable to manage medically, consult urology in the morning.  If she were to spike a fever or otherwise decompensate he recommends consulting with him again and possible need for transfer for stenting in that situation.  Otherwise admit to hospitalist here, managed medically and talk to urology again in the morning [DS]  1702 Reassessed and updated family of these findings and my conversation with urology.  They are agreeable. [DS]  1737 I consult with hospitalist who agrees to admit [DS]    Clinical Course User Index [DS] Claudene Rover, MD     FINAL CLINICAL IMPRESSION(S) / ED DIAGNOSES   Final diagnoses:  Acute metabolic encephalopathy  AKI (acute kidney injury) (HCC)  Dehydration  Prolonged Q-T interval on ECG  Ureteral stone     Rx / DC Orders   ED Discharge Orders     None        Note:  This document was prepared using Dragon voice recognition software and may include unintentional dictation errors.    Claudene Rover, MD 11/23/23 8292    Claudene Rover, MD 11/23/23 [phone]

## 2023-11-23 NOTE — ED Triage Notes (Addendum)
 Pt to ED via [facility] from home. EMS reports family called due to AMS x3 days. Pt reports increased weakness today and feels weaker on right side but family feels weakness on right is baseline. EMS reports when pt was walking to the truck she had a brief moment and went unresponsive. Initially BP 88/50. Cbg 203. Given 1000cc NS in route. 20g LH. Pt did reports to EMS difficulty urinating.

## 2023-11-23 NOTE — H&P (Signed)
 History and Physical    Patient: Victoria Potter FMW:[id number] DOB: 1950-11-13 DOA: 11/23/2023 DOS: the patient was seen and examined on 11/23/2023 PCP: Karn Olam LABOR, MD  Patient coming from: Home  Chief Complaint:  Chief Complaint  Patient presents with   Hypotension   HPI: Victoria Potter is a 73 y.o. female with medical history significant of HTN, T2DM, primary biliary cholangitis presenting from home for evaluation of worsening confusion for the past 2 to 3 days with associated decreased p.o. intake, nausea, chills. Patient is pleasantly confused, history as per daughter and husband present at bedside. Patient admits to dysuria and frequency, as well as RUQ and LUQ abdominal pain  No focal weakness, paresthesias, falls, syncope, vomiting, diarrhea.  On EMS, she was reportedly hypotensive with a blood pressure of 88/50, she was given a bolus en route.  In the emergency department, hypotension resolved and she was hemodynamically stable without leukocytosis. UA was indicative of UTI.  AKI was present with a BUN/creatinine 60/2.47.  CT abdomen pelvis with nonobstructing left 3 mm ureterolithiasis.   ED provider spoke with on-call urology who recommended observation for now. CT head unremarkable.  Mild transaminitis noted with no radiologic evidence of hepatobiliary disease.   Daughter and husband at bedside confirms she is full code  Admit for further management of confusion/UTI    Review of Systems: Review of Systems  Reason unable to perform ROS: limited due to confusion.  Constitutional:  Positive for chills and malaise/fatigue. Negative for fever and weight loss.  Eyes:  Negative for pain.  Cardiovascular:  Negative for chest pain and leg swelling.  Gastrointestinal:  Positive for abdominal pain and nausea. Negative for constipation, diarrhea and vomiting.  Genitourinary:  Positive for frequency and urgency.  Musculoskeletal:  Negative for falls.  Skin:  Negative for itching  and rash.  Neurological:  Negative for tremors, speech change, focal weakness, seizures, loss of consciousness and headaches.    Past Medical History:  Diagnosis Date   (HFpEF) heart failure with preserved ejection fraction (HCC)    a. 03/2021 Echo: EF 55-60%, GrII DD, mild MR.   Arthritis    Chest pain    Depression    Diabetes mellitus without complication (HCC)    Fibromyalgia    GERD (gastroesophageal reflux disease)    Hypertension    IBS (irritable bowel syndrome)    Mitral regurgitation    a. 03/2021 Echo: Mild MR.   NSVT (nonsustained ventricular tachycardia) (HCC)    01/2011 cMRI: EF 62%, nl RV fxn, no scar/infiltration, no ARVD.   Osteoporosis    Primary biliary cholangitis (HCC)    PVC's (premature ventricular contractions)    Rheumatoid arthritis (HCC)    Spinal stenosis    Stroke [facility])    Past Surgical History:  Procedure Laterality Date   cardiac oblation     Social History:  reports that she quit smoking about 19 years ago. Her smoking use included cigarettes. She started smoking about 44 years ago. She has a 12.5 pack-year smoking history. She does not have any smokeless tobacco history on file. She reports that she does not drink alcohol and does not use drugs.  Allergies  Allergen Reactions   Erythromycin Nausea And Vomiting   Sulfa Antibiotics Nausea And Vomiting    Family History  Problem Relation Age of Onset   Colon cancer Mother        Died at 47   Hypertension Mother    Osteoporosis Mother  Cancer Father    COPD Sister    Heart disease Sister    Heart disease Brother     Prior to Admission medications   Not on File    Physical Exam: Vitals:   11/23/23 1530 11/23/23 1545 11/23/23 1600 11/23/23 1630  BP: (!) 133/56  124/60 (!) 127/59  Pulse: 95 88 90 88  Resp: 16 13 18 13   Temp:      TempSrc:      SpO2: 99% 98% 99% 99%  Physical Exam Vitals reviewed.  Constitutional:      General: She is not in acute distress.    Appearance:  She is not ill-appearing.  HENT:     Head: Normocephalic and atraumatic.     Mouth/Throat:     Mouth: Mucous membranes are moist.  Eyes:     General: No scleral icterus.    Extraocular Movements: Extraocular movements intact.  Cardiovascular:     Rate and Rhythm: Normal rate and regular rhythm.  Pulmonary:     Effort: Pulmonary effort is normal.     Breath sounds: Normal breath sounds.  Abdominal:     Palpations: Abdomen is soft.     Tenderness: There is abdominal tenderness (RUQ). There is left CVA tenderness. There is no right CVA tenderness.  Musculoskeletal:     Cervical back: Neck supple.     Right lower leg: No edema.     Left lower leg: No edema.  Skin:    Coloration: Skin is not jaundiced.  Neurological:     Mental Status: She is alert. She is disoriented.     Motor: No weakness.      Data Reviewed:    Labs on Admission: I have personally reviewed following labs and imaging studies  CBC: Recent Labs  Lab 11/23/23 1514  WBC 10.3  NEUTROABS 8.5*  HGB 12.3  HCT 38.3  MCV 86.1  PLT 376   Basic Metabolic Panel: Recent Labs  Lab 11/23/23 1514  NA 141  K 3.3*  CL 104  CO2 25  GLUCOSE 214*  BUN 60*  CREATININE 2.47*  CALCIUM 9.3  MG 2.4   GFR: CrCl cannot be calculated (Unknown ideal weight.). Liver Function Tests: Recent Labs  Lab 11/23/23 1514  AST 65*  ALT 73*  ALKPHOS 94  BILITOT 3.2*  PROT 6.5  ALBUMIN 3.2*   No results for input(s): LIPASE, AMYLASE in the last 168 hours. No results for input(s): AMMONIA in the last 168 hours. Coagulation Profile: Recent Labs  Lab 11/23/23 1514  INR 1.2   Cardiac Enzymes: No results for input(s): CKTOTAL, CKMB, CKMBINDEX, TROPONINI in the last 168 hours. BNP (last 3 results) No results for input(s): PROBNP in the last 8760 hours. HbA1C: No results for input(s): HGBA1C in the last 72 hours. CBG: No results for input(s): GLUCAP in the last 168 hours. Lipid Profile: No  results for input(s): CHOL, HDL, LDLCALC, TRIG, CHOLHDL, LDLDIRECT in the last 72 hours. Thyroid Function Tests: No results for input(s): TSH, T4TOTAL, FREET4, T3FREE, THYROIDAB in the last 72 hours. Anemia Panel: No results for input(s): VITAMINB12, FOLATE, FERRITIN, TIBC, IRON, RETICCTPCT in the last 72 hours. Urine analysis:    Component Value Date/Time   COLORURINE AMBER (A) 11/23/2023 1514   APPEARANCEUR HAZY (A) 11/23/2023 1514   LABSPEC 1.022 11/23/2023 1514   PHURINE 5.0 11/23/2023 1514   GLUCOSEU 50 (A) 11/23/2023 1514   HGBUR MODERATE (A) 11/23/2023 1514   BILIRUBINUR NEGATIVE 11/23/2023 1514   KETONESUR NEGATIVE  11/23/2023 1514   PROTEINUR >=300 (A) 11/23/2023 1514   NITRITE NEGATIVE 11/23/2023 1514   LEUKOCYTESUR MODERATE (A) 11/23/2023 1514    Radiological Exams on Admission: CT HEAD WO CONTRAST ( ) Result Date: 11/23/2023 CLINICAL DATA:  nonfocal encephalopathy EXAM: CT HEAD WITHOUT CONTRAST TECHNIQUE: Contiguous axial images were obtained from the base of the skull through the vertex without intravenous contrast. RADIATION DOSE REDUCTION: This exam was performed according to the departmental dose-optimization program which includes automated exposure control, adjustment of the mA and/or kV according to patient size and/or use of iterative reconstruction technique. COMPARISON:  June 17, 2023 FINDINGS: Brain: Proportional prominence of the ventricles and sulci, consistent with diffuse cerebral parenchymal volume loss. The ventricles otherwise maintained midline position without midline shift. Gray-white differentiation is preserved.Scattered periventricular white matter hypoattenuation, most consistent with changes of mild chronic ischemic microvascular disease.No evidence of acute territorial infarction, extra-axial fluid collection, hemorrhage, or mass lesion. The basilar cisterns are patent without downward herniation. The cerebellar  hemispheres and vermis are well formed without mass lesion or focal attenuation abnormality. Vascular: No hyperdense vessel. Calcified atherosclerotic plaque within the cavernous/supraclinoid ICA and intradural vertebral arteries. Skull: Normal. Negative for fracture or focal lesion. Sinuses/Orbits: Small air-fluid level within the left sphenoid sinus. The remaining paranasal sinuses and mastoids are clear.The globes appear intact. No retrobulbar hematoma. Other: None. IMPRESSION: 1. No acute intracranial abnormality, specifically, no acute hemorrhage, territorial infarction, or intracranial mass. 2. Small air-fluid level within the left sphenoid sinus, which can be seen in acute sinusitis. Clinical correlation requested. Electronically Signed   By: Rogelia Myers M.D.   On: 11/23/2023 16:43   CT ABDOMEN PELVIS WO CONTRAST Result Date: 11/23/2023 CLINICAL DATA:  Transaminitis.  Elevated bilirubin. EXAM: CT ABDOMEN AND PELVIS WITHOUT CONTRAST TECHNIQUE: Multidetector CT imaging of the abdomen and pelvis was performed following the standard protocol without IV contrast. RADIATION DOSE REDUCTION: This exam was performed according to the departmental dose-optimization program which includes automated exposure control, adjustment of the mA and/or kV according to patient size and/or use of iterative reconstruction technique. FINDINGS: Lower chest: No acute abnormality. Hepatobiliary: No focal liver abnormality is seen. No gallstones, gallbladder wall thickening, or biliary dilatation. Pancreas: Unremarkable. No pancreatic ductal dilatation or surrounding inflammatory changes. Spleen: Normal in size without focal abnormality. Adrenals/Urinary Tract: There is a 3 mm calculus in the proximal left ureter. There is no significant hydronephrosis. There are additional punctate left renal calculi. The right kidney, adrenal glands, and bladder are within normal limits. Stomach/Bowel: Stomach is within normal limits. Appendix  is not seen. No evidence of bowel wall thickening, distention, or inflammatory changes. There is diffuse colonic diverticulosis. Vascular/Lymphatic: Aortic atherosclerosis. No enlarged abdominal or pelvic lymph nodes. Reproductive: Status post hysterectomy. No adnexal masses. Other: No abdominal wall hernia or abnormality. No abdominopelvic ascites. Musculoskeletal: No acute or significant osseous findings. IMPRESSION: 1. 3 mm calculus in the proximal left ureter without significant hydronephrosis. 2. Additional punctate left renal calculi. 3. Colonic diverticulosis without evidence for diverticulitis. Aortic Atherosclerosis (ICD10-I70.0). Electronically Signed   By: Greig Pique M.D.   On: 11/23/2023 16:33   DG Chest Port 1 View Result Date: 11/23/2023 CLINICAL DATA:  Question sepsis. EXAM: PORTABLE CHEST 1 VIEW COMPARISON:  Chest radiograph dated 06/17/2023. FINDINGS: Mild cardiomegaly with mild central vascular congestion. No focal consolidation, pleural effusion, pneumothorax. Atherosclerotic calcification of the aorta. No acute osseous pathology. IMPRESSION: Mild cardiomegaly with mild central vascular congestion. No focal consolidation. Electronically Signed   By: Arash  Radparvar  M.D.   On: 11/23/2023 15:43      Assessment and Plan: No notes have been filed under this hospital service. Service: Hospitalist  73 y.o. female with medical history significant of HTN, T2DM, ?primary biliary cholangitis presenting from home for evaluation of worsening confusion for the past 2 to 3 days, found to have UTI and AKI with obstructive left ureteral lithiasis  Metabolic enceaphalopathy  UTI  Nonobstructive left ureteral lithiasis - CT head unremarkable, does not meet SIRS criteria - Rocephin  pending urine culture - Check bladder scan, has history of retention - IV fluids, tamsulosin .  ED provider discussed with on-call urology, okay to observe and consult urology in the morning  AKI CKD 3 - Unclear  baseline, previous Cr 1.22.  Today BUN/creatinine 60/2.47, in the setting of decreased p.o. intake - Nonobstructing 3 mm calculi in the left ureter.  Okay to observe per ED providers discussion with urology - Avoid nephrotoxic agents, monitor I's and O's and renal indices  Transaminitis History of primary biliary cirrhosis -Unclear clinical significance.  Check inflammatory markers - CT abdomen pelvis without hepatobiliary abnormality - Consider checking ammonia if encephalopathy does not improve with treatment of the UTI  Type 2 diabetes - Continue Lantus  20 nightly (takes 36 units at home) and SSI for now  HTN - hold home antihypertensives for now, resume as indicated.   ADA diet Heparin  dvt ppx  LR at 75 Monitor/replace electrolytes (Home medications have not yet been reconciled at time of admission)    Advance Care Planning:   Code Status: Not on file discussed with daughter and husband present at bedside     Severity of Illness: The appropriate patient status for this patient is INPATIENT. Inpatient status is judged to be reasonable and necessary in order to provide the required intensity of service to ensure the patient's safety. The patient's presenting symptoms, physical exam findings, and initial radiographic and laboratory data in the context of their chronic comorbidities is felt to place them at high risk for further clinical deterioration. Furthermore, it is not anticipated that the patient will be medically stable for discharge from the hospital within 2 midnights of admission.   * I certify that at the point of admission it is my clinical judgment that the patient will require inpatient hospital care spanning beyond 2 midnights from the point of admission due to high intensity of service, high risk for further deterioration and high frequency of surveillance required.*  Author: Daved JAYSON Pump, DO 11/23/2023 5:38 PM  For on call review [email or web address].

## 2023-11-24 DIAGNOSIS — N179 Acute kidney failure, unspecified: Secondary | ICD-10-CM

## 2023-11-24 DIAGNOSIS — R338 Other retention of urine: Secondary | ICD-10-CM | POA: Diagnosis not present

## 2023-11-24 DIAGNOSIS — N201 Calculus of ureter: Secondary | ICD-10-CM | POA: Diagnosis not present

## 2023-11-24 DIAGNOSIS — N390 Urinary tract infection, site not specified: Secondary | ICD-10-CM | POA: Diagnosis not present

## 2023-11-24 DIAGNOSIS — R7401 Elevation of levels of liver transaminase levels: Secondary | ICD-10-CM

## 2023-11-24 DIAGNOSIS — N1831 Chronic kidney disease, stage 3a: Secondary | ICD-10-CM

## 2023-11-24 DIAGNOSIS — I10 Essential (primary) hypertension: Secondary | ICD-10-CM

## 2023-11-24 DIAGNOSIS — E1122 Type 2 diabetes mellitus with diabetic chronic kidney disease: Secondary | ICD-10-CM

## 2023-11-24 LAB — GLUCOSE, CAPILLARY
Glucose-Capillary: 115 mg/dL — ABNORMAL HIGH (ref 70–99)
Glucose-Capillary: 207 mg/dL — ABNORMAL HIGH (ref 70–99)
Glucose-Capillary: 103 mg/dL — ABNORMAL HIGH (ref 70–99)
Glucose-Capillary: 171 mg/dL — ABNORMAL HIGH (ref 70–99)

## 2023-11-24 LAB — CBC
HCT: 35.9 % — ABNORMAL LOW (ref 36.0–46.0)
Hemoglobin: 11.2 g/dL — ABNORMAL LOW (ref 12.0–15.0)
MCH: 27.6 pg (ref 26.0–34.0)
MCHC: 31.2 g/dL (ref 30.0–36.0)
MCV: 88.4 fL (ref 80.0–100.0)
Platelets: 302 K/uL (ref 150–400)
RBC: 4.06 MIL/uL (ref 3.87–5.11)
RDW: 12.9 % (ref 11.5–15.5)
WBC: 8 K/uL (ref 4.0–10.5)
nRBC: 0 % (ref 0.0–0.2)

## 2023-11-24 LAB — HIV ANTIBODY (ROUTINE TESTING W REFLEX): HIV Screen 4th Generation wRfx: NONREACTIVE

## 2023-11-24 LAB — COMPREHENSIVE METABOLIC PANEL WITH GFR
ALT: 64 U/L — ABNORMAL HIGH (ref 0–44)
AST: 60 U/L — ABNORMAL HIGH (ref 15–41)
Albumin: 2.9 g/dL — ABNORMAL LOW (ref 3.5–5.0)
Alkaline Phosphatase: 90 U/L (ref 38–126)
Anion gap: 8 (ref 5–15)
BUN: 63 mg/dL — ABNORMAL HIGH (ref 8–23)
CO2: 25 mmol/L (ref 22–32)
Calcium: 8.9 mg/dL (ref 8.9–10.3)
Chloride: 108 mmol/L (ref 98–111)
Creatinine, Ser: 2 mg/dL — ABNORMAL HIGH (ref 0.44–1.00)
GFR, Estimated: 26 mL/min — ABNORMAL LOW (ref >60)
Glucose, Bld: 114 mg/dL — ABNORMAL HIGH (ref 70–99)
Potassium: 3.6 mmol/L (ref 3.5–5.1)
Sodium: 141 mmol/L (ref 135–145)
Total Bilirubin: 1.8 mg/dL — ABNORMAL HIGH (ref 0.0–1.2)
Total Protein: 6.5 g/dL (ref 6.5–8.1)

## 2023-11-24 LAB — MAGNESIUM: Magnesium: 2.5 mg/dL — ABNORMAL HIGH (ref 1.7–2.4)

## 2023-11-24 LAB — PROTIME-INR
INR: 1.1 (ref 0.8–1.2)
Prothrombin Time: 15 s (ref 11.4–15.2)

## 2023-11-24 MED ORDER — LOSARTAN POTASSIUM 25 MG PO TABS
25.0000 mg | ORAL_TABLET | Freq: Every day | ORAL | Status: DC
  Administered 2023-11-24 – 2023-11-25 (×2): 25 mg via ORAL
  Filled 2023-11-24 (×2): qty 1

## 2023-11-24 MED ORDER — LOSARTAN POTASSIUM 25 MG PO TABS: 25.0000 mg | ORAL_TABLET | Freq: Once | ORAL | Status: DC

## 2023-11-24 MED ORDER — LACTATED RINGERS IV SOLN: INTRAVENOUS | Status: AC

## 2023-11-24 MED ORDER — HYDRALAZINE HCL 25 MG PO TABS
25.0000 mg | ORAL_TABLET | Freq: Four times a day (QID) | ORAL | Status: DC | PRN
  Administered 2023-11-24: 25 mg via ORAL
  Filled 2023-11-24: qty 1

## 2023-11-24 NOTE — Progress Notes (Signed)
 Progress Note   Patient: Victoria Potter FMW:[id number] DOB: 1951/03/19 DOA: 11/23/2023     1 DOS: the patient was seen and examined on 11/24/2023   Brief hospital course: Taken from H&P  Victoria Potter is a 73 y.o. female with medical history significant of HTN, T2DM, primary biliary cholangitis presenting from home for evaluation of worsening confusion for the past 2 to 3 days with associated decreased p.o. intake, nausea, chills.  Patient admits dysuria, increased frequency lower abdominal pain.  On presentation vital stable, no leukocytosis, AKI with BUN of 60 and creatinine of 2.47, UA indicative of UTI.  Mild transaminitis CT abdomen and pelvis with nonobstructing left 3 mm ureterolithiasis.  CT head was unremarkable.  No radiologic evidence of hepatobiliary disease.  EDP discussed with urology and they were recommending observation for now.  Started on Rocephin  and cultures were sent.  7/31: Vitals with mildly elevated blood pressure at 157/72, remained afebrile, mildly elevated CRP at 1, ESR 32, some improvement of creatinine to 2.00, baseline around 1.2.  Preliminary blood cultures negative and urine cultures pending. Patient was having some urinary retention, encouraging p.o. hydration and voiding at bedside commode-a failed then she might need In-N-Out catheter. Ordered PT and OT  Assessment and Plan: * UTI (urinary tract infection) Nonobstructing left ureterolithiasis. Patient did not meet any SIRS criteria so sepsis ruled out.  Urine cultures pending. - Continue with ceftriaxone  -Outpatient urology follow-up - Follow-up urine culture results  Acute urinary retention Concern of urinary retention, patient also has a prior history of retention. -Periodic bladder scan especially postvoid -Encourage voiding at bedside commode -Encourage p.o. hydration -Continue with Flomax  -If continue to have significant urinary retention then she might need catheter placement  AKI (acute  kidney injury) (HCC) Underlying CKD stage III A Improving creatinine, now at 2 with baseline seems to be around 1.2. - Continue with gentle hydration -Monitor renal function -Avoid nephrotoxins  Transaminitis Patient with history of questionable primary biliary cholangitis.  CT with no hepatobiliary abnormality. Might be due to poor p.o. intake as started improving. - Monitor liver function - Continue with IV hydration   Hypertension Blood pressure mildly elevated, do not see any antihypertensives on home meds. - Started on losartan  Continue to monitor-  T2DM (type 2 diabetes mellitus) (HCC) CBG currently within goal. - Continue with Semglee  and SSI  Subjective: Patient was eating lunch when seen today.  Per husband she is still confused but she was able to communicate appropriately with me.  Has not voided since morning.  History of recurrent urinary retentions.  Denies any pain.  Physical Exam: Vitals:   11/23/23 1957 11/23/23 2042 11/24/23 0354 11/24/23 0754  BP:  133/61 (!) 161/68 (!) 157/72  Pulse:  94 83 79  Resp:    16  Temp: 98.1 F (36.7 C)  98.5 F (36.9 C) (!) 97.5 F (36.4 C)  TempSrc: Oral  Oral Oral  SpO2:  99% 99% 100%  Weight:  67.2 kg    Height:  5' 5 (1.651 m)     General.  Frail elderly lady, in no acute distress. Pulmonary.  Lungs clear bilaterally, normal respiratory effort. CV.  Regular rate and rhythm, no JVD, rub or murmur. Abdomen.  Soft, nontender, nondistended, BS positive. CNS.  Alert and oriented .  No focal neurologic deficit. Extremities.  No edema, no cyanosis, pulses intact and symmetrical. Psychiatry.  Judgment and insight appears normal.   Data Reviewed: Prior data reviewed  Family Communication: Discussed with  husband at bedside  Disposition: Status is: Inpatient [facility] inpatient appropriate because: Severity of illness  Planned Discharge Destination: Home  DVT prophylaxis.  Subcu heparin  Time spent: 50  minutes  This record has been created using Conservation officer, historic buildings. Errors have been sought and corrected,but may not always be located. Such creation errors do not reflect on the standard of care.   Author: Amaryllis Dare, MD 11/24/2023 3:23 PM  For on call review [email or web address].

## 2023-11-24 NOTE — Assessment & Plan Note (Signed)
 Underlying CKD stage III A Improving creatinine, now at 2 with baseline seems to be around 1.2. - Continue with gentle hydration -Monitor renal function -Avoid nephrotoxins

## 2023-11-24 NOTE — Assessment & Plan Note (Signed)
 Blood pressure mildly elevated, do not see any antihypertensives on home meds. - Started on losartan  Continue to monitor-

## 2023-11-24 NOTE — Plan of Care (Signed)
  Problem: Education: Goal: Ability to describe self-care measures that may prevent or decrease complications (Diabetes Survival Skills Education) will improve Outcome: Progressing   Problem: Safety: Goal: Ability to remain free from injury will improve Outcome: Progressing   Problem: Skin Integrity: Goal: Risk for impaired skin integrity will decrease Outcome: Progressing

## 2023-11-24 NOTE — Assessment & Plan Note (Addendum)
 Nonobstructing left ureterolithiasis. Patient did not meet any SIRS criteria so sepsis ruled out.  Urine cultures pending. - Continue with ceftriaxone  -Outpatient urology follow-up - Follow-up urine culture results

## 2023-11-24 NOTE — Assessment & Plan Note (Signed)
 Concern of urinary retention, patient also has a prior history of retention. -Periodic bladder scan especially postvoid -Encourage voiding at bedside commode -Encourage p.o. hydration -Continue with Flomax  -If continue to have significant urinary retention then she might need catheter placement

## 2023-11-24 NOTE — Evaluation (Signed)
 Occupational Therapy Evaluation Patient Details Name: Victoria Potter MRN: [id number] DOB: Jun 09, 1950 Today's Date: 11/24/2023   History of Present Illness   Victoria Potter is a 73 y.o. female with medical history significant of HTN, T2DM, primary biliary cholangitis presenting from home for evaluation of worsening confusion for the past 2 to 3 days with associated decreased p.o. intake, nausea, chills.   Patient admits dysuria, increased frequency lower abdominal pain     Clinical Impressions Patient presenting with decreased Ind in self care,balance, functional mobility/transfers, endurance, and safety awareness. Pt with baseline cognitive deficits but supportive husband and daughter in room report she is more confused than baseline at this time.Husband having to correct her when she answers questions regarding home set up.Pt uses RW or furniture walks for mobility with occasional assistance for self care needs. Patient performing mobility and self care needs this session with min guard-min A. Pt is not far from baseline. Patient will benefit from acute OT to increase overall independence in the areas of ADLs, functional mobility, and safety awareness in order to safely discharge.     If plan is discharge home, recommend the following:   A little help with walking and/or transfers;A little help with bathing/dressing/bathroom;Direct supervision/assist for financial management;Direct supervision/assist for medications management;Assist for transportation;Supervision due to cognitive status;Help with stairs or ramp for entrance     Functional Status Assessment   Patient has had a recent decline in their functional status and demonstrates the ability to make significant improvements in function in a reasonable and predictable amount of time.     Equipment Recommendations   None recommended by OT      Precautions/Restrictions   Precautions Precautions: Fall     Mobility Bed  Mobility Overal bed mobility: Needs Assistance Bed Mobility: Supine to Sit     Supine to sit: Supervision          Transfers Overall transfer level: Needs assistance Equipment used: 1 person hand held assist Transfers: Sit to/from Stand Sit to Stand: Supervision                  Balance Overall balance assessment: Needs assistance Sitting-balance support: Feet supported Sitting balance-Leahy Scale: Good     Standing balance support: During functional activity, No upper extremity supported, Bilateral upper extremity supported Standing balance-Leahy Scale: Fair                             ADL either performed or assessed with clinical judgement   ADL Overall ADL's : Needs assistance/impaired     Grooming: Wash/dry hands;Standing;Contact guard assist                   Toilet Transfer: Minimal assistance;Rolling walker (2 wheels)                   Vision Baseline Vision/History: 1 Wears glasses Patient Visual Report: No change from baseline              Pertinent Vitals/Pain Pain Assessment Pain Assessment: No/denies pain     Extremity/Trunk Assessment Upper Extremity Assessment Upper Extremity Assessment: Defer to OT evaluation   Lower Extremity Assessment Lower Extremity Assessment: Generalized weakness   Cervical / Trunk Assessment Cervical / Trunk Assessment: Normal   Communication Communication Communication: No apparent difficulties Factors Affecting Communication: Hearing impaired   Cognition Arousal: Alert Behavior During Therapy: WFL for tasks assessed/performed Cognition: History of cognitive impairments  OT - Cognition Comments: Family in room does report cognition is more impaired than normal during evaluation                 Following commands: Intact       Cueing  General Comments   Cueing Techniques: Verbal cues;Gestural cues              Home Living Family/patient  expects to be discharged to:: Private residence Living Arrangements: Spouse/significant other Available Help at Discharge: Family;Available 24 hours/day Type of Home: House Home Access: Stairs to enter [employer] of Steps: 1 Entrance Stairs-Rails: Right Home Layout: One level     Bathroom Shower/Tub: Tub/shower unit         Home Equipment: Agricultural consultant (2 wheels);Rollator (4 wheels);Shower seat          Prior Functioning/Environment Prior Level of Function : Independent/Modified Independent;Needs assist       Physical Assist : Mobility (physical);ADLs (physical) Mobility (physical): Gait ADLs (physical): Bathing Mobility Comments: furniture walks but sometimes uses RW (depends on how she feels) ADLs Comments: Pt is Ind with bathing and dressing but needs assistance getting in/out of shower for safety    OT Problem List: Decreased strength;Impaired balance (sitting and/or standing);Decreased cognition;Decreased safety awareness;Decreased activity tolerance   OT Treatment/Interventions: Self-care/ADL training;Therapeutic exercise;Balance training;Energy conservation;Therapeutic activities      OT Goals(Current goals can be found in the care plan section)   Acute Rehab OT Goals Patient Stated Goal: to go home OT Goal Formulation: With patient/family Time For Goal Achievement: 12/08/23 Potential to Achieve Goals: Fair ADL Goals Pt Will Perform Grooming: with modified independence;standing Pt Will Perform Lower Body Dressing: with modified independence;sit to/from stand Pt Will Transfer to Toilet: with modified independence;ambulating Pt Will Perform Toileting - Clothing Manipulation and hygiene: with modified independence;sit to/from stand   OT Frequency:  Min 2X/week       AM-PAC OT 6 Clicks Daily Activity     Outcome Measure Help from another person eating meals?: None Help from another person taking care of personal grooming?: A Little Help  from another person toileting, which includes using toliet, bedpan, or urinal?: A Little Help from another person bathing (including washing, rinsing, drying)?: A Little Help from another person to put on and taking off regular upper body clothing?: None Help from another person to put on and taking off regular lower body clothing?: A Little 6 Click Score: 20   End of Session Equipment Utilized During Treatment: Rolling walker (2 wheels) Nurse Communication: Mobility status  Activity Tolerance: Patient tolerated treatment well Patient left: in bed;with call bell/phone within reach;with family/visitor present  OT Visit Diagnosis: Unsteadiness on feet (R26.81);Repeated falls (R29.6)                Time: 8596-8575 OT Time Calculation (min): 21 min Charges:  OT General Charges $OT Visit: 1 Visit OT Evaluation $OT Eval Low Complexity: 1 Low OT Treatments $Self Care/Home Management : 8-22 mins  Izetta Claude, MS, OTR/L , CBIS ascom [phone]  11/24/23, 4:14 PM

## 2023-11-24 NOTE — Assessment & Plan Note (Signed)
 Patient with history of questionable primary biliary cholangitis.  CT with no hepatobiliary abnormality. Might be due to poor p.o. intake as started improving. - Monitor liver function - Continue with IV hydration

## 2023-11-24 NOTE — Assessment & Plan Note (Signed)
 CBG currently within goal. - Continue with Semglee  and SSI

## 2023-11-24 NOTE — Hospital Course (Addendum)
 Taken from H&P  Victoria Potter is a 73 y.o. female with medical history significant of HTN, T2DM, primary biliary cholangitis presenting from home for evaluation of worsening confusion for the past 2 to 3 days with associated decreased p.o. intake, nausea, chills.  Patient admits dysuria, increased frequency lower abdominal pain.  On presentation vital stable, no leukocytosis, AKI with BUN of 60 and creatinine of 2.47, UA indicative of UTI.  Mild transaminitis CT abdomen and pelvis with nonobstructing left 3 mm ureterolithiasis.  CT head was unremarkable.  No radiologic evidence of hepatobiliary disease.  EDP discussed with urology and they were recommending observation for now.  Started on Rocephin  and cultures were sent.  7/31: Vitals with mildly elevated blood pressure at 157/72, remained afebrile, mildly elevated CRP at 1, ESR 32, some improvement of creatinine to 2.00, baseline around 1.2.  Preliminary blood cultures negative and urine cultures pending. Patient was now able to void.  8/1: Remained hemodynamically stable, PT and OT with no follow-up recommendations.  Creatinine improved to 1.24, mild hypokalemia at 3.2 which is being repleted.  Urine cultures with pansensitive Staph aureus, patient was instructed for genital and skin hygiene.  Patient did not show any obvious sign of infection as there was no fever or leukocytosis.  Clinically appears at baseline.  Intermittent confusion but able to answer questions appropriately.  Might be developing mild underlying dementia which need to be investigated by PCP as outpatient.  Blood pressure elevated-apparently she was taking multiple antihypertensives at home, medications were restarted.  Patient was given 5-day course of Keflex, also started on Flomax  with her complaint of intermittent difficulty with urination.  She was able to void well now.  Patient need to follow-up with urology for definitive treatment of nephrolithiasis.  She will  continue on current medications and follow-up with her providers closely for further assistance.

## 2023-11-24 NOTE — Evaluation (Signed)
 Physical Therapy Evaluation Patient Details Name: Victoria Potter MRN: [id number] DOB: Aug 24, 1950 Today's Date: 11/24/2023  History of Present Illness  Victoria Potter is a 73 y.o. female with medical history significant of HTN, T2DM, primary biliary cholangitis presenting from home for evaluation of worsening confusion for the past 2 to 3 days with associated decreased p.o. intake, nausea, chills.   Patient admits dysuria, increased frequency lower abdominal pain   Clinical Impression  Patient received in bed, spouse at bedside. Patient is trying to order dinner and required assistance. Patient is mod I with bed mobility. She stands with min/cga. Patient ambulated 30 feet with RW and cga. Cues for direction, safe walker negotiation needed. She will continue to benefit from skilled PT to improve safety and independence.         If plan is discharge home, recommend the following: A little help with walking and/or transfers;A little help with bathing/dressing/bathroom;Assist for transportation;Help with stairs or ramp for entrance   Can travel by private vehicle    yes    Equipment Recommendations None recommended by PT  Recommendations for Other Services       Functional Status Assessment Patient has had a recent decline in their functional status and demonstrates the ability to make significant improvements in function in a reasonable and predictable amount of time.     Precautions / Restrictions Precautions Precautions: Fall Recall of Precautions/Restrictions: Impaired Restrictions Weight Bearing Restrictions Per Provider Order: No      Mobility  Bed Mobility Overal bed mobility: Modified Independent Bed Mobility: Supine to Sit, Sit to Supine     Supine to sit: Min assist Sit to supine: Min assist        Transfers Overall transfer level: Needs assistance Equipment used: Rolling walker (2 wheels) Transfers: Sit to/from Stand Sit to Stand: Contact guard assist                 Ambulation/Gait Ambulation/Gait assistance: Contact guard assist, Min assist Gait Distance (Feet): 30 Feet Assistive device: Rolling walker (2 wheels) Gait Pattern/deviations: Step-through pattern, Decreased step length - right, Decreased step length - left, Decreased stride length Gait velocity: decr     General Gait Details: Patient requires cga to min A for safe walker negotiation  Stairs            Wheelchair Mobility     Tilt Bed    Modified Rankin (Stroke Patients Only)       Balance Overall balance assessment: Needs assistance Sitting-balance support: Feet supported Sitting balance-Leahy Scale: Good     Standing balance support: Bilateral upper extremity supported, During functional activity, Reliant on assistive device for balance Standing balance-Leahy Scale: Fair                               Pertinent Vitals/Pain Pain Assessment Pain Assessment: No/denies pain    Home Living Family/patient expects to be discharged to:: Private residence Living Arrangements: Spouse/significant other Available Help at Discharge: Family;Available 24 hours/day Type of Home: House Home Access: Stairs to enter Entrance Stairs-Rails: Right Entrance Stairs-Number of Steps: 1   Home Layout: One level Home Equipment: Agricultural consultant (2 wheels);Rollator (4 wheels);Shower seat      Prior Function Prior Level of Function : Independent/Modified Independent;Needs assist       Physical Assist : Mobility (physical);ADLs (physical) Mobility (physical): Gait ADLs (physical): Bathing Mobility Comments: furniture walks but sometimes uses RW (depends on how she  feels) ADLs Comments: Pt is Ind with bathing and dressing but needs assistance getting in/out of shower for safety     Extremity/Trunk Assessment   Upper Extremity Assessment Upper Extremity Assessment: Defer to OT evaluation    Lower Extremity Assessment Lower Extremity Assessment:  Generalized weakness    Cervical / Trunk Assessment Cervical / Trunk Assessment: Normal  Communication   Communication Communication: No apparent difficulties Factors Affecting Communication: Hearing impaired    Cognition Arousal: Alert Behavior During Therapy: WFL for tasks assessed/performed   PT - Cognitive impairments: History of cognitive impairments, Orientation, Awareness, Problem solving, Safety/Judgement   Orientation impairments: Place, Time, Situation                     Following commands: Intact       Cueing Cueing Techniques: Verbal cues, Gestural cues, Tactile cues     General Comments      Exercises     Assessment/Plan    PT Assessment Patient needs continued PT services  PT Problem List Decreased strength;Decreased activity tolerance;Decreased balance;Decreased mobility;Decreased cognition;Decreased safety awareness;Decreased knowledge of use of DME       PT Treatment Interventions DME instruction;Gait training;Stair training;Functional mobility training;Therapeutic activities;Therapeutic exercise;Cognitive remediation;Patient/family education    PT Goals (Current goals can be found in the Care Plan section)  Acute Rehab PT Goals Patient Stated Goal: to return home. Patient/family decline HHPT PT Goal Formulation: With patient/family Time For Goal Achievement: 12/01/23 Potential to Achieve Goals: Good    Frequency Min 3X/week     Co-evaluation               AM-PAC PT 6 Clicks Mobility  Outcome Measure Help needed turning from your back to your side while in a flat bed without using bedrails?: A Little Help needed moving from lying on your back to sitting on the side of a flat bed without using bedrails?: A Little Help needed moving to and from a bed to a chair (including a wheelchair)?: A Little Help needed standing up from a chair using your arms (e.g., wheelchair or bedside chair)?: A Little Help needed to walk in hospital  room?: A Little Help needed climbing 3-5 steps with a railing? : A Little 6 Click Score: 18    End of Session   Activity Tolerance: Patient tolerated treatment well Patient left: in bed;with call bell/phone within reach;with bed alarm set Nurse Communication: Mobility status PT Visit Diagnosis: Unsteadiness on feet (R26.81);Muscle weakness (generalized) (M62.81);Difficulty in walking, not elsewhere classified (R26.2)    Time: 8459-8445 PT Time Calculation (min) (ACUTE ONLY): 14 min   Charges:   PT Evaluation $PT Eval Low Complexity: 1 Low   PT General Charges $$ ACUTE PT VISIT: 1 Visit         Bryson Gavia, PT, GCS 11/24/23,4:10 PM

## 2023-11-25 DIAGNOSIS — N390 Urinary tract infection, site not specified: Secondary | ICD-10-CM | POA: Diagnosis not present

## 2023-11-25 DIAGNOSIS — N179 Acute kidney failure, unspecified: Secondary | ICD-10-CM | POA: Diagnosis not present

## 2023-11-25 DIAGNOSIS — R338 Other retention of urine: Secondary | ICD-10-CM | POA: Diagnosis not present

## 2023-11-25 DIAGNOSIS — N201 Calculus of ureter: Secondary | ICD-10-CM | POA: Diagnosis not present

## 2023-11-25 LAB — COMPREHENSIVE METABOLIC PANEL WITH GFR
ALT: 72 U/L — ABNORMAL HIGH (ref 0–44)
AST: 61 U/L — ABNORMAL HIGH (ref 15–41)
Albumin: 2.9 g/dL — ABNORMAL LOW (ref 3.5–5.0)
Alkaline Phosphatase: 94 U/L (ref 38–126)
Anion gap: 6 (ref 5–15)
BUN: 37 mg/dL — ABNORMAL HIGH (ref 8–23)
CO2: 24 mmol/L (ref 22–32)
Calcium: 8.7 mg/dL — ABNORMAL LOW (ref 8.9–10.3)
Chloride: 106 mmol/L (ref 98–111)
Creatinine, Ser: 1.24 mg/dL — ABNORMAL HIGH (ref 0.44–1.00)
GFR, Estimated: 47 mL/min — ABNORMAL LOW (ref >60)
Glucose, Bld: 151 mg/dL — ABNORMAL HIGH (ref 70–99)
Potassium: 3.2 mmol/L — ABNORMAL LOW (ref 3.5–5.1)
Sodium: 136 mmol/L (ref 135–145)
Total Bilirubin: 1 mg/dL (ref 0.0–1.2)
Total Protein: 6 g/dL — ABNORMAL LOW (ref 6.5–8.1)

## 2023-11-25 LAB — GLUCOSE, CAPILLARY
Glucose-Capillary: 100 mg/dL — ABNORMAL HIGH (ref 70–99)
Glucose-Capillary: 207 mg/dL — ABNORMAL HIGH (ref 70–99)

## 2023-11-25 LAB — URINE CULTURE: Culture: >=100000 — AB

## 2023-11-25 MED ORDER — CLONIDINE HCL 0.1 MG PO TABS
0.2000 mg | ORAL_TABLET | Freq: Two times a day (BID) | ORAL | Status: DC
  Administered 2023-11-25: 0.2 mg via ORAL
  Filled 2023-11-25: qty 2

## 2023-11-25 MED ORDER — CEPHALEXIN 500 MG PO CAPS: 500.0000 mg | ORAL_CAPSULE | Freq: Three times a day (TID) | ORAL | 0 refills | Status: AC

## 2023-11-25 MED ORDER — PANTOPRAZOLE SODIUM 40 MG PO TBEC: 40.0000 mg | DELAYED_RELEASE_TABLET | Freq: Every day | ORAL | 1 refills | Status: AC

## 2023-11-25 MED ORDER — CLONIDINE HCL 0.1 MG PO TABS: 0.2000 mg | ORAL_TABLET | Freq: Two times a day (BID) | ORAL | Status: DC

## 2023-11-25 MED ORDER — HYDRALAZINE HCL 100 MG PO TABS: 100.0000 mg | ORAL_TABLET | Freq: Three times a day (TID) | ORAL | Status: DC

## 2023-11-25 MED ORDER — PANTOPRAZOLE SODIUM 40 MG PO TBEC
40.0000 mg | DELAYED_RELEASE_TABLET | Freq: Every day | ORAL | Status: DC
  Administered 2023-11-25: 40 mg via ORAL
  Filled 2023-11-25: qty 1

## 2023-11-25 MED ORDER — HYDRALAZINE HCL 50 MG PO TABS
100.0000 mg | ORAL_TABLET | Freq: Three times a day (TID) | ORAL | Status: DC
  Administered 2023-11-25: 100 mg via ORAL
  Filled 2023-11-25: qty 2

## 2023-11-25 MED ORDER — POTASSIUM CHLORIDE 20 MEQ PO PACK
40.0000 meq | PACK | Freq: Once | ORAL | Status: AC
  Administered 2023-11-25: 40 meq via ORAL
  Filled 2023-11-25: qty 2

## 2023-11-25 MED ORDER — TAMSULOSIN HCL 0.4 MG PO CAPS: 0.4000 mg | ORAL_CAPSULE | Freq: Every day | ORAL | 1 refills | Status: DC

## 2023-11-25 MED ORDER — LOSARTAN POTASSIUM 50 MG PO TABS: 50.0000 mg | ORAL_TABLET | Freq: Every day | ORAL | Status: DC

## 2023-11-25 NOTE — Progress Notes (Signed)
 Occupational Therapy Treatment Patient Details Name: Victoria Potter MRN: [id number] DOB: [birth year] Today's Date: 11/25/2023   History of present illness Victoria Potter is a 73 y.o. female with medical history significant of HTN, T2DM, primary biliary cholangitis presenting from home for evaluation of worsening confusion for the past 2 to 3 days with associated decreased p.o. intake, nausea, chills.   Patient admits dysuria, increased frequency lower abdominal pain   OT comments  Upon entering the room, pt supine in bed and finishing her breakfast. Pt performing supine >sit without assistance and stands with min guard. Pt dons B socks with figure four position. Pt ambulates with min A and pushes IV pole 200' with supervision for safety and direction to locate room. Pt reports feeling too fatigued to stand at sink to brush teeth with was prior plan. Pt returns to bed with call bell and all needed items within reach. Daughter remain in room and call bell within reach.       If plan is discharge home, recommend the following:  A little help with walking and/or transfers;A little help with bathing/dressing/bathroom;Direct supervision/assist for financial management;Direct supervision/assist for medications management;Assist for transportation;Supervision due to cognitive status;Help with stairs or ramp for entrance   Equipment Recommendations  None recommended by OT       Precautions / Restrictions Precautions Precautions: Fall Recall of Precautions/Restrictions: Impaired       Mobility Bed Mobility Overal bed mobility: Modified Independent Bed Mobility: Supine to Sit, Sit to Supine     Supine to sit: Supervision, Contact guard Sit to supine: Supervision        Transfers Overall transfer level: Needs assistance Equipment used: Rolling walker (2 wheels) Transfers: Sit to/from Stand Sit to Stand: Contact guard assist                 Balance Overall balance assessment: Needs  assistance Sitting-balance support: Feet supported Sitting balance-Leahy Scale: Good     Standing balance support: Bilateral upper extremity supported, During functional activity, Reliant on assistive device for balance Standing balance-Leahy Scale: Fair                             ADL either performed or assessed with clinical judgement   ADL Overall ADL's : Needs assistance/impaired                     Lower Body Dressing: Supervision/safety;Sit to/from stand Lower Body Dressing Details (indicate cue type and reason): figure four position                    Extremity/Trunk Assessment Upper Extremity Assessment Upper Extremity Assessment: Generalized weakness   Lower Extremity Assessment Lower Extremity Assessment: Generalized weakness        Vision Patient Visual Report: No change from baseline           Communication Communication Communication: No apparent difficulties Factors Affecting Communication: Hearing impaired   Cognition Arousal: Alert Behavior During Therapy: WFL for tasks assessed/performed Cognition: History of cognitive impairments, Cognition impaired   Orientation impairments: Place, Time, Situation Awareness: Intellectual awareness impaired, Online awareness impaired   Attention impairment (select first level of impairment): Sustained attention Executive functioning impairment (select all impairments): Organization, Reasoning, Sequencing, Problem solving OT - Cognition Comments: Daughter reports pt is more confused this morning than yesterday                 Following commands:  Intact        Cueing   Cueing Techniques: Verbal cues, Gestural cues, Tactile cues  Exercises              Pertinent Vitals/ Pain       Pain Assessment Pain Assessment: No/denies pain         Frequency  Min 2X/week        Progress Toward Goals  OT Goals(current goals can now be found in the care plan section)   Progress towards OT goals: Progressing toward goals      AM-PAC OT 6 Clicks Daily Activity     Outcome Measure   Help from another person eating meals?: None Help from another person taking care of personal grooming?: A Little Help from another person toileting, which includes using toliet, bedpan, or urinal?: A Little Help from another person bathing (including washing, rinsing, drying)?: A Little Help from another person to put on and taking off regular upper body clothing?: None Help from another person to put on and taking off regular lower body clothing?: A Little 6 Click Score: 20    End of Session Equipment Utilized During Treatment: Rolling walker (2 wheels)  OT Visit Diagnosis: Unsteadiness on feet (R26.81);Repeated falls (R29.6)   Activity Tolerance Patient tolerated treatment well   Patient Left in bed;with call bell/phone within reach;with family/visitor present   Nurse Communication Mobility status        Time: 9044-8983 OT Time Calculation (min): 21 min  Charges: OT General Charges $OT Visit: 1 Visit OT Treatments $Therapeutic Activity: 8-22 mins  Izetta Claude, MS, OTR/L , CBIS ascom [phone]  11/25/23, 1:42 PM

## 2023-11-25 NOTE — Plan of Care (Signed)
  Problem: Coping: Goal: Level of anxiety will decrease Outcome: Progressing   Problem: Elimination: Goal: Will not experience complications related to urinary retention Outcome: Progressing   Problem: Pain Managment: Goal: General experience of comfort will improve and/or be controlled Outcome: Progressing   Problem: Skin Integrity: Goal: Risk for impaired skin integrity will decrease Outcome: Progressing

## 2023-11-25 NOTE — Care Management Important Message (Signed)
 Important Message  Patient Details  Name: Victoria Potter MRN: [id number] Date of Birth: [birth year]   Important Message Given:  Yes - Medicare IM     [name] 11/25/2023, 12:37 PM

## 2023-11-25 NOTE — Discharge Summary (Signed)
 Physician Discharge Summary   Patient: Victoria Potter MRN: [id number] DOB: 12-18-1950  Admit date:     11/23/2023  Discharge date: 11/25/23  Discharge Physician: Victoria Potter   PCP: Victoria Olam LABOR, MD   Recommendations at discharge:  Please obtain CBC and BMP on follow-up Follow-up with urology for definitive treatment of nephrolithiasis. Follow-up with primary care provider Please ensure completion of antibiotic.  Discharge Diagnoses: Principal Problem:   UTI (urinary tract infection) Active Problems:   Ureterolithiasis   AKI (acute kidney injury) (HCC)   Acute urinary retention   Transaminitis   Hypertension   T2DM (type 2 diabetes mellitus) [facility])   Hospital Course: Taken from H&P  Victoria Potter is a 73 y.o. female with medical history significant of HTN, T2DM, primary biliary cholangitis presenting from home for evaluation of worsening confusion for the past 2 to 3 days with associated decreased p.o. intake, nausea, chills.  Patient admits dysuria, increased frequency lower abdominal pain.  On presentation vital stable, no leukocytosis, AKI with BUN of 60 and creatinine of 2.47, UA indicative of UTI.  Mild transaminitis CT abdomen and pelvis with nonobstructing left 3 mm ureterolithiasis.  CT head was unremarkable.  No radiologic evidence of hepatobiliary disease.  EDP discussed with urology and they were recommending observation for now.  Started on Rocephin  and cultures were sent.  7/31: Vitals with mildly elevated blood pressure at 157/72, remained afebrile, mildly elevated CRP at 1, ESR 32, some improvement of creatinine to 2.00, baseline around 1.2.  Preliminary blood cultures negative and urine cultures pending. Patient was now able to void.  8/1: Remained hemodynamically stable, PT and OT with no follow-up recommendations.  Creatinine improved to 1.24, mild hypokalemia at 3.2 which is being repleted.  Urine cultures with pansensitive Staph aureus, patient was  instructed for genital and skin hygiene.  Patient did not show any obvious sign of infection as there was no fever or leukocytosis.  Clinically appears at baseline.  Intermittent confusion but able to answer questions appropriately.  Might be developing mild underlying dementia which need to be investigated by PCP as outpatient.  Blood pressure elevated-apparently she was taking multiple antihypertensives at home, medications were restarted.  Patient was given 5-day course of Keflex, also started on Flomax  with her complaint of intermittent difficulty with urination.  She was able to void well now.  Patient need to follow-up with urology for definitive treatment of nephrolithiasis.  She will continue on current medications and follow-up with her providers closely for further assistance.  Assessment and Plan: * UTI (urinary tract infection) Nonobstructing left ureterolithiasis. Patient did not meet any SIRS criteria so sepsis ruled out.  Urine cultures with pansensitive Staph aureus which is little unusual for UTI, blood cultures remain negative. Received ceftriaxone  while in the hospital and is being discharged on Keflex -Outpatient urology follow-up  Acute urinary retention Concern of urinary retention, patient also has a prior history of retention.  Improved - Current Victoria Potter with Flomax   AKI (acute kidney injury) (HCC) Underlying CKD stage III A Improved with creatinine now at 1.24 which is at baseline  Transaminitis Patient with history of questionable primary biliary cholangitis.  CT with no hepatobiliary abnormality. Might be due to poor p.o. intake as started improving. - Monitor liver function  Hypertension Blood pressure elevated, patient was apparently taking multiple antihypertensives at home. - Continue with home meds  T2DM (type 2 diabetes mellitus) (HCC) CBG currently within goal. - Continue with Semglee  and SSI  Consultants: Curbside  urology Procedures performed:  None Disposition: Home Diet recommendation:  Discharge Diet Orders (From admission, onward)     Start     Ordered   11/25/23 0000  Diet - low sodium heart healthy        11/25/23 1437           Cardiac and Carb modified diet DISCHARGE MEDICATION: Allergies as of 11/25/2023       Reactions   Erythromycin Nausea And Vomiting   Sulfa Antibiotics Nausea And Vomiting        Medication List     TAKE these medications    cephALEXin 500 MG capsule Commonly known as: KEFLEX Take 1 capsule (500 mg total) by mouth 3 (three) times daily for 5 days.   cloNIDine 0.2 MG tablet Commonly known as: CATAPRES Take 0.2 mg by mouth 2 (two) times daily.   felodipine 10 MG 24 hr tablet Commonly known as: PLENDIL Take 10 mg by mouth daily.   gabapentin 300 MG capsule Commonly known as: NEURONTIN Take 300 mg by mouth at bedtime.   hydrALAZINE  100 MG tablet Commonly known as: APRESOLINE  Take 100 mg by mouth 3 (three) times daily.   isosorbide mononitrate 60 MG 24 hr tablet Commonly known as: IMDUR Take 60 mg by mouth daily.   lisinopril 40 MG tablet Commonly known as: ZESTRIL Take 40 mg by mouth daily.   pantoprazole 40 MG tablet Commonly known as: PROTONIX Take 1 tablet (40 mg total) by mouth daily. Start taking on: November 26, 2023   prednisoLONE 5 MG Tabs tablet Take by mouth.   tamsulosin  0.4 MG Caps capsule Commonly known as: FLOMAX  Take 1 capsule (0.4 mg total) by mouth daily after breakfast. Start taking on: November 26, 2023        Follow-up Information     Victoria Olam LABOR, MD. Schedule an appointment as soon as possible for a visit in 1 week(s).   Specialty: Pediatrics Contact information: [address] [place] [address] [phone]                Discharge Exam: Victoria Potter   11/23/23 2042  Weight: 67.2 kg   General.  Well-developed elderly lady, in no acute distress. Pulmonary.  Lungs clear bilaterally, normal respiratory  effort. CV.  Regular rate and rhythm, no JVD, rub or murmur. Abdomen.  Soft, nontender, nondistended, BS positive. CNS.  Alert and oriented .  No focal neurologic deficit. Extremities.  No edema, no cyanosis, pulses intact and symmetrical. Psychiatry.  Judgment and insight appears normal.   Condition at discharge: stable  The results of significant diagnostics from this hospitalization (including imaging, microbiology, ancillary and laboratory) are listed below for reference.   Imaging Studies: CT HEAD WO CONTRAST ( ) Result Date: 11/23/2023 CLINICAL DATA:  nonfocal encephalopathy EXAM: CT HEAD WITHOUT CONTRAST TECHNIQUE: Contiguous axial images were obtained from the base of the skull through the vertex without intravenous contrast. RADIATION DOSE REDUCTION: This exam was performed according to the departmental dose-optimization program which includes automated exposure control, adjustment of the mA and/or kV according to patient size and/or use of iterative reconstruction technique. COMPARISON:  June 17, 2023 FINDINGS: Brain: Proportional prominence of the ventricles and sulci, consistent with diffuse cerebral parenchymal volume loss. The ventricles otherwise maintained midline position without midline shift. Gray-white differentiation is preserved.Scattered periventricular white matter hypoattenuation, most consistent with changes of mild chronic ischemic microvascular disease.No evidence of acute territorial infarction, extra-axial fluid collection, hemorrhage, or mass lesion. The basilar  cisterns are patent without downward herniation. The cerebellar hemispheres and vermis are well formed without mass lesion or focal attenuation abnormality. Vascular: No hyperdense vessel. Calcified atherosclerotic plaque within the cavernous/supraclinoid ICA and intradural vertebral arteries. Skull: Normal. Negative for fracture or focal lesion. Sinuses/Orbits: Small air-fluid level within the left sphenoid  sinus. The remaining paranasal sinuses and mastoids are clear.The globes appear intact. No retrobulbar hematoma. Other: None. IMPRESSION: 1. No acute intracranial abnormality, specifically, no acute hemorrhage, territorial infarction, or intracranial mass. 2. Small air-fluid level within the left sphenoid sinus, which can be seen in acute sinusitis. Clinical correlation requested. Electronically Signed   By: Rogelia Myers M.D.   On: 11/23/2023 16:43   CT ABDOMEN PELVIS WO CONTRAST Result Date: 11/23/2023 CLINICAL DATA:  Transaminitis.  Elevated bilirubin. EXAM: CT ABDOMEN AND PELVIS WITHOUT CONTRAST TECHNIQUE: Multidetector CT imaging of the abdomen and pelvis was performed following the standard protocol without IV contrast. RADIATION DOSE REDUCTION: This exam was performed according to the departmental dose-optimization program which includes automated exposure control, adjustment of the mA and/or kV according to patient size and/or use of iterative reconstruction technique. FINDINGS: Lower chest: No acute abnormality. Hepatobiliary: No focal liver abnormality is seen. No gallstones, gallbladder wall thickening, or biliary dilatation. Pancreas: Unremarkable. No pancreatic ductal dilatation or surrounding inflammatory changes. Spleen: Normal in size without focal abnormality. Adrenals/Urinary Tract: There is a 3 mm calculus in the proximal left ureter. There is no significant hydronephrosis. There are additional punctate left renal calculi. The right kidney, adrenal glands, and bladder are within normal limits. Stomach/Bowel: Stomach is within normal limits. Appendix is not seen. No evidence of bowel wall thickening, distention, or inflammatory changes. There is diffuse colonic diverticulosis. Vascular/Lymphatic: Aortic atherosclerosis. No enlarged abdominal or pelvic lymph nodes. Reproductive: Status post hysterectomy. No adnexal masses. Other: No abdominal wall hernia or abnormality. No abdominopelvic  ascites. Musculoskeletal: No acute or significant osseous findings. IMPRESSION: 1. 3 mm calculus in the proximal left ureter without significant hydronephrosis. 2. Additional punctate left renal calculi. 3. Colonic diverticulosis without evidence for diverticulitis. Aortic Atherosclerosis (ICD10-I70.0). Electronically Signed   By: Greig Pique M.D.   On: 11/23/2023 16:33   DG Chest Port 1 View Result Date: 11/23/2023 CLINICAL DATA:  Question sepsis. EXAM: PORTABLE CHEST 1 VIEW COMPARISON:  Chest radiograph dated 06/17/2023. FINDINGS: Mild cardiomegaly with mild central vascular congestion. No focal consolidation, pleural effusion, pneumothorax. Atherosclerotic calcification of the aorta. No acute osseous pathology. IMPRESSION: Mild cardiomegaly with mild central vascular congestion. No focal consolidation. Electronically Signed   By: Vanetta Chou M.D.   On: 11/23/2023 15:43    Microbiology: Results for orders placed or performed during the hospital encounter of 11/23/23  Blood Culture (routine x 2)     Status: None (Preliminary result)   Collection Time: 11/23/23  3:14 PM   Specimen: BLOOD  Result Value Ref Range Status   Specimen Description BLOOD BLOOD LEFT ARM  Final   Special Requests Blood Culture adequate volume  Final   Culture   Final    NO GROWTH 2 DAYS Performed at [facility], [address], [place], [place] [address]    Report Status PENDING  Incomplete  Blood Culture (routine x 2)     Status: None (Preliminary result)   Collection Time: 11/23/23  3:14 PM   Specimen: BLOOD  Result Value Ref Range Status   Specimen Description BLOOD BLOOD RIGHT ARM  Final   Special Requests   Final    Blood Culture  results may not be optimal due to an inadequate volume of blood received in culture bottles   Culture   Final    NO GROWTH 2 DAYS Performed at [facility], [address] [place], [place] [address]    Report Status PENDING  Incomplete  Urine Culture      Status: Abnormal   Collection Time: 11/23/23  3:14 PM   Specimen: Urine, Catheterized  Result Value Ref Range Status   Specimen Description   Final    URINE, CATHETERIZED Performed at [facility], [address] [place], [place] [address]    Special Requests   Final    NONE Performed at [facility], [address] [place], [place] [address]    Culture >=100,000 COLONIES/mL STAPHYLOCOCCUS AUREUS (A)  Final   Report Status 11/25/2023 FINAL  Final   Organism ID, Bacteria STAPHYLOCOCCUS AUREUS (A)  Final      Susceptibility   Staphylococcus aureus - MIC*    CIPROFLOXACIN <=0.5 SENSITIVE Sensitive     GENTAMICIN <=0.5 SENSITIVE Sensitive     NITROFURANTOIN <=16 SENSITIVE Sensitive     OXACILLIN <=0.25 SENSITIVE Sensitive     TETRACYCLINE <=1 SENSITIVE Sensitive     VANCOMYCIN <=0.5 SENSITIVE Sensitive     TRIMETH/SULFA <=10 SENSITIVE Sensitive     RIFAMPIN <=0.5 SENSITIVE Sensitive     Inducible Clindamycin NEGATIVE Sensitive     LINEZOLID 2 SENSITIVE Sensitive     * >=100,000 COLONIES/mL STAPHYLOCOCCUS AUREUS    Labs: CBC: Recent Labs  Lab 11/23/23 1514 11/24/23 0335  WBC 10.3 8.0  NEUTROABS 8.5*  --   HGB 12.3 11.2*  HCT 38.3 35.9*  MCV 86.1 88.4  PLT 376 302   Basic Metabolic Panel: Recent Labs  Lab 11/23/23 1514 11/24/23 0335 11/25/23 0303  NA 141 141 136  K 3.3* 3.6 3.2*  CL 104 108 106  CO2 25 25 24   GLUCOSE 214* 114* 151*  BUN 60* 63* 37*  CREATININE 2.47* 2.00* 1.24*  CALCIUM 9.3 8.9 8.7*  MG 2.4 2.5*  --    Liver Function Tests: Recent Labs  Lab 11/23/23 1514 11/24/23 0335 11/25/23 0303  AST 65* 60* 61*  ALT 73* 64* 72*  ALKPHOS 94 90 94  BILITOT 3.2* 1.8* 1.0  PROT 6.5 6.5 6.0*  ALBUMIN 3.2* 2.9* 2.9*   CBG: Recent Labs  Lab 11/24/23 1159 11/24/23 1709 11/24/23 2128 11/25/23 0803 11/25/23 1146  GLUCAP 207* 103* 171* 100* 207*    Discharge time spent: greater than 30 minutes.  This record has been  created using Conservation officer, historic buildings. Errors have been sought and corrected,but may not always be located. Such creation errors do not reflect on the standard of care.   Signed: Amaryllis Dare, MD [facility] 11/25/2023

## 2023-11-25 NOTE — TOC CM/SW Note (Signed)
 Transition of Care [facility]) - Inpatient Brief Assessment   Patient Details  Name: Victoria Potter MRN: [id number] Date of Birth: 1950-06-10  Transition of Care [facility]) CM/SW Contact:    Corean ONEIDA Haddock, RN Phone Number: 11/25/2023, 12:06 PM   Clinical Narrative:   Transition of Care (TOC) Screening Note   Patient Details  Name: Victoria Potter Date of Birth: 1950/05/31   Transition of Care [facility]) CM/SW Contact:    Corean ONEIDA Haddock, RN Phone Number: 11/25/2023, 12:06 PM    Transition of Care Department [facility]) has reviewed patient and no TOC needs have been identified at this time.  If new patient transition needs arise, please place a TOC consult.    Transition of Care Asessment: Insurance and Status: Insurance coverage has been reviewed Patient has primary care physician: Yes     Prior/Current Home Services: No current home services Social Drivers of Health Review: SDOH reviewed no interventions necessary Readmission risk has been reviewed: Yes Transition of care needs: no transition of care needs at this time

## 2023-12-01 LAB — CULTURE, BLOOD (ROUTINE X 2)
Culture: NO GROWTH
Culture: NO GROWTH
Special Requests: ADEQUATE

## 2024-05-19 ENCOUNTER — Emergency Department
Admission: EM | Admit: 2024-05-19 | Discharge: 2024-05-19 | Disposition: A | Discharge status: Home / Self Care | Attending: Emergency Medicine | Admitting: Emergency Medicine

## 2024-05-19 ENCOUNTER — Other Ambulatory Visit: Payer: Self-pay

## 2024-05-19 DIAGNOSIS — E119 Type 2 diabetes mellitus without complications: Secondary | ICD-10-CM | POA: Diagnosis not present

## 2024-05-19 DIAGNOSIS — R108A3 Suprapubic tenderness: Secondary | ICD-10-CM | POA: Insufficient documentation

## 2024-05-19 DIAGNOSIS — R410 Disorientation, unspecified: Secondary | ICD-10-CM | POA: Insufficient documentation

## 2024-05-19 DIAGNOSIS — R339 Retention of urine, unspecified: Secondary | ICD-10-CM | POA: Insufficient documentation

## 2024-05-19 LAB — URINALYSIS, ROUTINE W REFLEX MICROSCOPIC
Bilirubin Urine: NEGATIVE
Glucose, UA: NEGATIVE mg/dL
Hgb urine dipstick: NEGATIVE
Ketones, ur: NEGATIVE mg/dL
Leukocytes,Ua: NEGATIVE
Nitrite: NEGATIVE
Protein, ur: NEGATIVE mg/dL
Specific Gravity, Urine: 1.004 — ABNORMAL LOW (ref 1.005–1.030)
pH: 7 (ref 5.0–8.0)

## 2024-05-19 LAB — BASIC METABOLIC PANEL WITH GFR
Anion gap: 13 (ref 5–15)
BUN: 20 mg/dL (ref 8–23)
CO2: 21 mmol/L — ABNORMAL LOW (ref 22–32)
Calcium: 9.6 mg/dL (ref 8.9–10.3)
Chloride: 101 mmol/L (ref 98–111)
Creatinine, Ser: 1.09 mg/dL — ABNORMAL HIGH (ref 0.44–1.00)
GFR, Estimated: 54 mL/min — ABNORMAL LOW
Glucose, Bld: 167 mg/dL — ABNORMAL HIGH (ref 70–99)
Potassium: 3.8 mmol/L (ref 3.5–5.1)
Sodium: 135 mmol/L (ref 135–145)

## 2024-05-19 LAB — CBC
HCT: 35.8 % — ABNORMAL LOW (ref 36.0–46.0)
Hemoglobin: 11.6 g/dL — ABNORMAL LOW (ref 12.0–15.0)
MCH: 28.4 pg (ref 26.0–34.0)
MCHC: 32.4 g/dL (ref 30.0–36.0)
MCV: 87.7 fL (ref 80.0–100.0)
Platelets: 343 10*3/uL (ref 150–400)
RBC: 4.08 MIL/uL (ref 3.87–5.11)
RDW: 12.1 % (ref 11.5–15.5)
WBC: 11 10*3/uL — ABNORMAL HIGH (ref 4.0–10.5)
nRBC: 0 % (ref 0.0–0.2)

## 2024-05-19 MED ORDER — TAMSULOSIN HCL 0.4 MG PO CAPS: 0.4000 mg | ORAL_CAPSULE | Freq: Every day | ORAL | 0 refills | Status: AC

## 2024-05-19 MED ORDER — SODIUM CHLORIDE 0.9 % IV BOLUS
500.0000 mL | Freq: Once | INTRAVENOUS | Status: AC
  Administered 2024-05-19: 500 mL via INTRAVENOUS

## 2024-05-19 NOTE — ED Provider Notes (Signed)
 "  [facility] Provider Note    Event Date/Time   First MD Initiated Contact with Patient 05/19/24 1041     (approximate)  History   Chief Complaint: Urinary Retention  HPI  Victoria Potter is a 74 y.o. female with a past medical history of depression, fibromyalgia, diabetes, CVA, possible early dementia per husband, presents to the emergency department with difficulty urinating this morning and some confusion.  According to the husband the confusion comes and goes, but this morning along with the confusion patient was saying she was not able to urinate but felt like she needed to so he brought her to the emergency department.  Here the patient is awake alert she is in no distress and does not feel like she can urinate currently.  Denies any dysuria.  No fever at home.  Does state some mild lower abdominal pain.  Physical Exam   Triage Vital Signs: ED Triage Vitals  Encounter Vitals Group     BP 05/19/24 1024 (!) 163/63     Girls Systolic BP Percentile --      Girls Diastolic BP Percentile --      Boys Systolic BP Percentile --      Boys Diastolic BP Percentile --      Pulse Rate 05/19/24 1024 (!) 109     Resp 05/19/24 1024 18     Temp 05/19/24 1024 98.4 F (36.9 C)     Temp Source 05/19/24 1024 Oral     SpO2 05/19/24 1024 98 %     Weight 05/19/24 1026 130 lb (59 kg)     Height 05/19/24 1026 5' 5 (1.651 m)     Head Circumference --      Peak Flow --      Pain Score 05/19/24 1025 10     Pain Loc --      Pain Education --      Exclude from Growth Chart --     Most recent vital signs: Vitals:   05/19/24 1024 05/19/24 1200  BP: (!) 163/63 (!) 174/83  Pulse: (!) 109 85  Resp: 18   Temp: 98.4 F (36.9 C)   SpO2: 98% 100%    General: Awake, no distress.  CV:  Good peripheral perfusion.  Regular rate and rhythm Resp:  Normal effort.  Equal breath sounds bilaterally.  Abd:  No distention.  Soft, states mild tenderness to suprapubic palpation.   Otherwise benign abdomen.  ED Results / Procedures / Treatments   MEDICATIONS ORDERED IN ED: Medications  sodium chloride  0.9 % bolus 500 mL (500 mLs Intravenous New Bag/Given 05/19/24 1140)     IMPRESSION / MDM / ASSESSMENT AND PLAN / ED COURSE  I reviewed the triage vital signs and the nursing notes.  Patient's presentation is most consistent with acute presentation with potential threat to life or bodily function.  Patient presents emergency department with concerns of being unable to urinate this morning as well as some confusion.  Husband states that confusion is fairly typical and comes and goes has been told she may have mild dementia she has also had a CVA previously.  As far as the urine patient did have approximately 474 cc of urine on bladder scan.  We began an IV with IV hydration shortly afterwards the patient was able to urinate over 300 cc and has urinated once again a large amount.  No concern for ongoing retention.  Patient's urinalysis is normal showing no sign of infection CBC  is reassuring and chemistry reassuring as well.  Patient appears well she has no complaints.  Husband stepped out I attempted to update him as well as call his phone we will wait for him to return but anticipate discharge home with outpatient follow-up.  FINAL CLINICAL IMPRESSION(S) / ED DIAGNOSES   Urinary retention, resolved Confusion, resolved    Note:  This document was prepared using Dragon voice recognition software and may include unintentional dictation errors.   Dorothyann Drivers, MD 05/19/24 1429  "

## 2024-05-19 NOTE — Discharge Instructions (Addendum)
 Please begin taking Flomax  as prescribed each morning.  Please follow-up with your doctor regarding today's ER visit.  Return to the emergency department for any worsening symptoms or any symptom personally concerning to yourself.

## 2024-05-19 NOTE — ED Triage Notes (Addendum)
 Pt arrives via POV with c/o urinary retention that started this morning. Pt denies any burning nor pain when they urinate. Per family, pt felt deathly this morning, and wasn't able to get warm. Pt has a hx of memory impairment, and strokes. Pt is alert to self.
